# Patient Record
Sex: Female | Born: 1990
Health system: Southern US, Community
[De-identification: ages and names within clinical notes are randomized; demographics above are authoritative.]

## PROBLEM LIST (undated history)

## (undated) DIAGNOSIS — J45909 Unspecified asthma, uncomplicated: Secondary | ICD-10-CM

## (undated) DIAGNOSIS — G43909 Migraine, unspecified, not intractable, without status migrainosus: Secondary | ICD-10-CM

## (undated) DIAGNOSIS — T7840XA Allergy, unspecified, initial encounter: Secondary | ICD-10-CM

## (undated) HISTORY — PX: NO PAST SURGERIES: SHX2092

## (undated) HISTORY — DX: Unspecified asthma, uncomplicated: J45.909

## (undated) HISTORY — PX: WISDOM TOOTH EXTRACTION: SHX21

## (undated) HISTORY — DX: Migraine, unspecified, not intractable, without status migrainosus: G43.909

## (undated) HISTORY — DX: Allergy, unspecified, initial encounter: T78.40XA

---
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
## 2010-11-27 ENCOUNTER — Emergency Department: Payer: Self-pay | Admitting: Emergency Medicine

## 2013-01-24 ENCOUNTER — Encounter: Payer: Self-pay | Admitting: Family Medicine

## 2013-01-24 ENCOUNTER — Ambulatory Visit (INDEPENDENT_AMBULATORY_CARE_PROVIDER_SITE_OTHER): Payer: BC Managed Care – PPO | Admitting: Family Medicine

## 2013-01-24 VITALS — BP 112/72 | HR 65 | Temp 97.8°F | Ht 65.0 in | Wt 159.2 lb

## 2013-01-24 DIAGNOSIS — G43009 Migraine without aura, not intractable, without status migrainosus: Secondary | ICD-10-CM

## 2013-01-24 MED ORDER — SUMATRIPTAN SUCCINATE 100 MG PO TABS: 100.0000 mg | ORAL_TABLET | Freq: Every day | ORAL | Status: DC | PRN

## 2013-01-24 MED ORDER — CYCLOBENZAPRINE HCL 10 MG PO TABS: 5.0000 mg | ORAL_TABLET | Freq: Every day | ORAL | Status: DC | PRN

## 2013-01-24 NOTE — Patient Instructions (Signed)
l'll get a migraine diet for you.  Wean off the caffeine.  Try to avoid using tylenol, excedrin, ibuprofen, etc.  Take flexeril when you have a headache starting.  Take 1/2 a tab initially.  It can make you drowsy.  If not improved, you can take imitrex.  Give me an update early next week.  We may need to get you on daily meds for headache prevention.   Take care.

## 2013-01-25 ENCOUNTER — Telehealth: Payer: Self-pay | Admitting: *Deleted

## 2013-01-25 ENCOUNTER — Encounter: Payer: Self-pay | Admitting: Family Medicine

## 2013-01-25 DIAGNOSIS — G43009 Migraine without aura, not intractable, without status migrainosus: Secondary | ICD-10-CM | POA: Insufficient documentation

## 2013-01-25 NOTE — Progress Notes (Signed)
New patient.   Frequent HA.  Progressive since high school.  Throbs, nausea, light but not sound sensitive.  Would like to lay down in dark room with they happen.  Most days of month.  Some worse than others, 3-4 "bad" ones a month.  No aura.  Frontal pain.  Prev MRI neg.  Worse with menses.  No known concussions.  Caffeine helps.  Taking ibuprofen or similar most days with limited effect.   PMH and SH reviewed  ROS: See HPI, otherwise noncontributory.  Meds, vitals, and allergies reviewed.   GEN: nad, alert and oriented HEENT: mucous membranes moist NECK: supple w/o LA CV: rrr.  no murmur PULM: ctab, no inc wob ABD: soft, +bs EXT: no edema SKIN: no acute rash CN 2-12 wnl B, S/S/DTR wnl x4

## 2013-01-25 NOTE — Telephone Encounter (Signed)
Message copied by Binnie Kand on Wed Jan 25, 2013  4:24 PM ------      Message from: Joaquim Nam      Created: Wed Jan 25, 2013 11:21 AM       I printed a migraine diet, should be on the printer.  Please mail to patient.  Thanks.  Let me know if it doesn't print.              Clelia Croft       ------

## 2013-01-25 NOTE — Assessment & Plan Note (Signed)
Complicated by med overuse.  Would try flexeril for HA now with imitrex as backup, with routine cautions on both.  Will mail pt migraine diet handout.  Attempt to wean off caffeine, limit nsaids, report back with update.  Will likely need proph med.  Will see how patient is doing when she updates me.  Okay for outpatient f/u.  Nontoxic.  She agrees with plan.  Per patient, other outside records are in route. >30 min spent with face to face with patient, >50% counseling and/or coordinating care.

## 2013-01-25 NOTE — Telephone Encounter (Signed)
Diet printed and mailed to patient as instructed.

## 2013-01-26 ENCOUNTER — Encounter: Payer: Self-pay | Admitting: Family Medicine

## 2013-01-26 LAB — CHOLESTEROL, TOTAL
Creat: 0.75
Glucose: 106
TSH: 1.8

## 2013-02-06 ENCOUNTER — Other Ambulatory Visit: Payer: Self-pay | Admitting: Family Medicine

## 2013-02-06 ENCOUNTER — Encounter: Payer: Self-pay | Admitting: Family Medicine

## 2013-02-06 MED ORDER — CYCLOBENZAPRINE HCL 5 MG PO TABS: 2.5000 mg | ORAL_TABLET | Freq: Every day | ORAL | Status: DC | PRN

## 2013-02-07 ENCOUNTER — Other Ambulatory Visit: Payer: Self-pay | Admitting: Family Medicine

## 2013-02-07 MED ORDER — CYCLOBENZAPRINE HCL 5 MG PO TABS: 2.5000 mg | ORAL_TABLET | Freq: Every day | ORAL | Status: DC | PRN

## 2013-04-06 ENCOUNTER — Encounter: Payer: Self-pay | Admitting: Family Medicine

## 2013-04-06 ENCOUNTER — Ambulatory Visit (INDEPENDENT_AMBULATORY_CARE_PROVIDER_SITE_OTHER): Payer: BC Managed Care – PPO | Admitting: Family Medicine

## 2013-04-06 VITALS — BP 114/70 | HR 79 | Temp 97.9°F | Wt 165.2 lb

## 2013-04-06 DIAGNOSIS — R05 Cough: Secondary | ICD-10-CM | POA: Insufficient documentation

## 2013-04-06 DIAGNOSIS — R059 Cough, unspecified: Secondary | ICD-10-CM

## 2013-04-06 MED ORDER — FEXOFENADINE-PSEUDOEPHED ER 180-240 MG PO TB24: 1.0000 | ORAL_TABLET | Freq: Every day | ORAL | Status: DC

## 2013-04-06 MED ORDER — ALBUTEROL SULFATE HFA 108 (90 BASE) MCG/ACT IN AERS: 2.0000 | INHALATION_SPRAY | Freq: Four times a day (QID) | RESPIRATORY_TRACT | Status: DC | PRN

## 2013-04-06 NOTE — Assessment & Plan Note (Addendum)
Likely allergy induced.  Sinuses not ttp.  Doesn't tolerate nasal steroids. Would use SABA for now and allegra D in meantime. That should help.  Doesn't appear to be infectious. Nontoxic.

## 2013-04-06 NOTE — Patient Instructions (Signed)
Try allegra D and use the inhaler for the cough.  This should gradually get better.   Take care.  I would get a flu shot each fall.

## 2013-04-06 NOTE — Progress Notes (Signed)
Her HAs are much improved.  Flexeril helps the HA a lot.    Cough.  Trouble usually in spring and fall.  H/o asthma, season.  No wheeze.  Started after a cat dander exposure about 4 days ago.  Hasn't recently used SABA.  tmax ~99 the last 2 days.  Stuffy, not better with allegra and zyrtec.  Nonsmoker.  occ sputum, clear.  She doesn't tolerate nasal sprays.   Meds, vitals, and allergies reviewed.   ROS: See HPI.  Otherwise, noncontributory.  GEN: nad, alert and oriented HEENT: mucous membranes moist, tm w/o erythema, nasal exam w/o erythema, clear discharge noted,  OP with cobblestoning, sinuses not ttp NECK: supple w/o LA CV: rrr.   PULM: ctab, no inc wob EXT: no edema SKIN: no acute rash

## 2013-05-25 ENCOUNTER — Other Ambulatory Visit: Payer: Self-pay

## 2013-10-11 ENCOUNTER — Encounter: Payer: Self-pay | Admitting: Family Medicine

## 2013-10-11 ENCOUNTER — Ambulatory Visit (INDEPENDENT_AMBULATORY_CARE_PROVIDER_SITE_OTHER): Payer: BC Managed Care – PPO | Admitting: Family Medicine

## 2013-10-11 VITALS — BP 104/64 | HR 100 | Temp 99.0°F | Wt 171.5 lb

## 2013-10-11 DIAGNOSIS — J019 Acute sinusitis, unspecified: Secondary | ICD-10-CM

## 2013-10-11 MED ORDER — AMOXICILLIN-POT CLAVULANATE 875-125 MG PO TABS: 1.0000 | ORAL_TABLET | Freq: Two times a day (BID) | ORAL | Status: DC

## 2013-10-11 MED ORDER — BENZONATATE 200 MG PO CAPS: 200.0000 mg | ORAL_CAPSULE | Freq: Three times a day (TID) | ORAL | Status: DC | PRN

## 2013-10-11 NOTE — Assessment & Plan Note (Signed)
With second sickening.  Augmentin, tessalon, f/u prn.  See instructions.  Nontoxic.  D/w pt.

## 2013-10-11 NOTE — Patient Instructions (Signed)
Start the antibiotics and use the tessalon for the cough.  Drink plenty of fluids, take tylenol as needed, and gargle with warm salt water for your throat.  This should gradually improve.  Take care.  Let us know if you have other concerns.   Out of work tomorrow and likely Friday.

## 2013-10-11 NOTE — Progress Notes (Signed)
Pre visit review using our clinic review tool, if applicable. No additional management support is needed unless otherwise documented below in the visit note.  Sx started a few weeks ago, she got better, then got sick again.  Most recent downturn started about 1 week ago.  Was stuffy, then had a cough and fever.  Some sputum, yellow.  Ear pain, ST, rhinorrhea.  More chest sx recently.  No GI sx. No rash.  Maxillary pain B.    Meds, vitals, and allergies reviewed.   ROS: See HPI.  Otherwise, noncontributory.  GEN: nad, alert and oriented HEENT: mucous membranes moist, tm w/o erythema, nasal exam w/o erythema, clear discharge noted,  OP with cobblestoning, sinuses ttp x4 NECK: supple w/o LA CV: rrr.   PULM: ctab, no inc wob EXT: no edema SKIN: no acute rash

## 2013-10-16 ENCOUNTER — Encounter: Payer: Self-pay | Admitting: Family Medicine

## 2013-10-16 ENCOUNTER — Other Ambulatory Visit: Payer: Self-pay | Admitting: Family Medicine

## 2013-10-16 MED ORDER — HYDROCODONE-HOMATROPINE 5-1.5 MG/5ML PO SYRP: 5.0000 mL | ORAL_SOLUTION | Freq: Three times a day (TID) | ORAL | Status: DC | PRN

## 2014-02-01 ENCOUNTER — Ambulatory Visit (INDEPENDENT_AMBULATORY_CARE_PROVIDER_SITE_OTHER): Payer: BC Managed Care – PPO | Admitting: Family Medicine

## 2014-02-01 ENCOUNTER — Encounter: Payer: Self-pay | Admitting: Family Medicine

## 2014-02-01 VITALS — BP 98/72 | HR 78 | Temp 98.1°F | Wt 169.2 lb

## 2014-02-01 DIAGNOSIS — J069 Acute upper respiratory infection, unspecified: Secondary | ICD-10-CM

## 2014-02-01 MED ORDER — FLUTICASONE PROPIONATE 50 MCG/ACT NA SUSP: 2.0000 | Freq: Every day | NASAL | Status: DC

## 2014-02-01 NOTE — Assessment & Plan Note (Signed)
Nontoxic, with ETD.  D/w pt.  Would use flonase for now and fu prn. Supportive care o/w.  She agrees.  See instructions.

## 2014-02-01 NOTE — Patient Instructions (Signed)
Use the flonase daily and gently try to pop your ears.   Drink plenty of fluids.   Take care.

## 2014-02-01 NOTE — Progress Notes (Signed)
Pre visit review using our clinic review tool, if applicable. No additional management support is needed unless otherwise documented below in the visit note.  Sx started recently, ~1-2 days ago.  R ear pain.  Post nasal gtt.  L sided maxillary pain.  No pain chewing.  No FCNAVD.  Some cough.  Hasn't used her inhaler recently.    Meds, vitals, and allergies reviewed.   ROS: See HPI.  Otherwise, noncontributory.  GEN: nad, alert and oriented HEENT: mucous membranes moist, tm w/o erythema, nasal exam w/o erythema, clear discharge noted,  OP with cobblestoning, R ETD noted on exam. L max sinus ttp NECK: supple w/o LA CV: rrr.   PULM: ctab, no inc wob EXT: no edema SKIN: no acute rash

## 2014-09-17 ENCOUNTER — Ambulatory Visit (INDEPENDENT_AMBULATORY_CARE_PROVIDER_SITE_OTHER): Payer: 59 | Admitting: Family Medicine

## 2014-09-17 ENCOUNTER — Encounter: Payer: Self-pay | Admitting: Family Medicine

## 2014-09-17 VITALS — BP 126/86 | HR 92 | Temp 98.6°F | Wt 173.0 lb

## 2014-09-17 DIAGNOSIS — R059 Cough, unspecified: Secondary | ICD-10-CM

## 2014-09-17 DIAGNOSIS — R05 Cough: Secondary | ICD-10-CM

## 2014-09-17 MED ORDER — AZITHROMYCIN 250 MG PO TABS: ORAL_TABLET | ORAL | Status: DC

## 2014-09-17 MED ORDER — HYDROCOD POLST-CHLORPHEN POLST 10-8 MG/5ML PO LQCR: 5.0000 mL | Freq: Two times a day (BID) | ORAL | Status: DC | PRN

## 2014-09-17 NOTE — Progress Notes (Signed)
Pre visit review using our clinic review tool, if applicable. No additional management support is needed unless otherwise documented below in the visit note.  duration of symptoms: about 1 month, worse recently.   Rhinorrhea: yes, some Congestion: yes ear pain: yes, R>L sore throat: no Cough: yes, started recently.  SABA didn't help.  No wheeze.  Sputum noted, she didn't check the color.   myalgias:no other concerns:no fevers now, possibly previous.  No vomiting, no diarrhea, no rash.    ROS: See HPI.  Otherwise negative.    Meds, vitals, and allergies reviewed.   GEN: nad, alert and oriented HEENT: mucous membranes moist, TM w/o erythema, nasal epithelium injected, OP with cobblestoning, R SOM noted. , sinuses not ttp.  NECK: supple w/o LA CV: rrr. PULM: ctab, no inc wob ABD: soft, +bs EXT: no edema

## 2014-09-17 NOTE — Patient Instructions (Signed)
Use the cough medicine for now.  Rest and fluids in the meantime.  Out of work for now.  Start the antibiotic if not better in a few days.  Take care.  Glad to see you.

## 2014-09-17 NOTE — Assessment & Plan Note (Signed)
Nontoxic, ctab, no wheeze.  Use tussionex for now and then start zmax in a few days if not better.  She agrees.  Okay for outpatient f/u.

## 2014-09-26 ENCOUNTER — Telehealth: Payer: Self-pay | Admitting: Family Medicine

## 2014-09-26 ENCOUNTER — Encounter: Payer: Self-pay | Admitting: Family Medicine

## 2014-09-26 NOTE — Telephone Encounter (Signed)
Please call pt about getting seen again, today or tomorrow.  Needs recheck for cough.  See mychart message.  Thanks.

## 2014-09-27 NOTE — Telephone Encounter (Signed)
Patient advised. Appointment scheduled.  

## 2014-09-28 ENCOUNTER — Ambulatory Visit (INDEPENDENT_AMBULATORY_CARE_PROVIDER_SITE_OTHER)
Admission: RE | Admit: 2014-09-28 | Discharge: 2014-09-28 | Disposition: A | Discharge status: Home / Self Care | Payer: 59 | Source: Ambulatory Visit | Attending: Family Medicine | Admitting: Family Medicine

## 2014-09-28 ENCOUNTER — Ambulatory Visit (INDEPENDENT_AMBULATORY_CARE_PROVIDER_SITE_OTHER): Payer: 59 | Admitting: Family Medicine

## 2014-09-28 ENCOUNTER — Encounter: Payer: Self-pay | Admitting: Family Medicine

## 2014-09-28 VITALS — BP 110/72 | HR 96 | Temp 98.6°F | Wt 176.2 lb

## 2014-09-28 DIAGNOSIS — R059 Cough, unspecified: Secondary | ICD-10-CM

## 2014-09-28 DIAGNOSIS — R05 Cough: Secondary | ICD-10-CM

## 2014-09-28 MED ORDER — TRAMADOL HCL 50 MG PO TABS: 50.0000 mg | ORAL_TABLET | Freq: Three times a day (TID) | ORAL | Status: DC | PRN

## 2014-09-28 NOTE — Patient Instructions (Signed)
Try tramadol for the cough during he day.  It may still make you drowsy, but not as much as the cough syrup.   Try to get some rest.  Go to the lab on the way out.  We'll contact you with your xray report.

## 2014-09-28 NOTE — Progress Notes (Signed)
Pre visit review using our clinic review tool, if applicable. No additional management support is needed unless otherwise documented below in the visit note.  Cough.  SABA didn't help at all.  Cough syrup helps at night, but she can't take it during the day.  Sick for about 1 month.  Done with zmax now.  No FCNAV.  Still with cough, still with some sputum.  Still with some wheeze per patient.  No ear pain, no ST.  Some rhinorrhea.  Tessalon didn't help her cough prev.  She is some better today than the last few days, less frequent cough.  She has had fatigue, some better today.    LMP was 09/09/14.  She hasn't missed a period.    Meds, vitals, and allergies reviewed.   ROS: See HPI.  Otherwise, noncontributory.  GEN: nad, alert and oriented HEENT: mucous membranes moist NECK: supple w/o LA CV: rrr.   PULM: ctab, no inc wob, cough noted.  No wheeze.  ABD: soft, +bs EXT: no edema SKIN: no acute rash

## 2014-09-30 NOTE — Assessment & Plan Note (Signed)
Can try tramadol for the cough during the day.  Sedation caution. Rest and fluids o/w.  See notes on CXR.  Nontoxic.

## 2015-02-28 ENCOUNTER — Ambulatory Visit (INDEPENDENT_AMBULATORY_CARE_PROVIDER_SITE_OTHER)
Admission: RE | Admit: 2015-02-28 | Discharge: 2015-02-28 | Disposition: A | Discharge status: Home / Self Care | Payer: 59 | Source: Ambulatory Visit | Attending: Family Medicine | Admitting: Family Medicine

## 2015-02-28 ENCOUNTER — Ambulatory Visit (INDEPENDENT_AMBULATORY_CARE_PROVIDER_SITE_OTHER): Payer: 59 | Admitting: Family Medicine

## 2015-02-28 ENCOUNTER — Encounter: Payer: Self-pay | Admitting: Family Medicine

## 2015-02-28 VITALS — BP 110/70 | HR 88 | Temp 98.2°F | Wt 178.8 lb

## 2015-02-28 DIAGNOSIS — S92302A Fracture of unspecified metatarsal bone(s), left foot, initial encounter for closed fracture: Secondary | ICD-10-CM

## 2015-02-28 DIAGNOSIS — S93402A Sprain of unspecified ligament of left ankle, initial encounter: Secondary | ICD-10-CM

## 2015-02-28 DIAGNOSIS — S92353A Displaced fracture of fifth metatarsal bone, unspecified foot, initial encounter for closed fracture: Secondary | ICD-10-CM | POA: Insufficient documentation

## 2015-02-28 NOTE — Progress Notes (Signed)
Pre visit review using our clinic review tool, if applicable. No additional management support is needed unless otherwise documented below in the visit note.  Twisted L ankle 1 week ago.  She was doing step ups/downs in a class.  She didn't step down correctly and inverted her L ankle.  Swelled thereafter.  Proximal bruising now, then it travelled distally.  Can bear weight.  Not painful not unless she misses a step.  She has walking boot and has been using it.  Icing her foot.  Not needing ibuprofen.  LMP 02/07/15, normal.   She has gotten better each day for the last few days, clearly improved.  H/o L ankle sprain in the past.    Meds, vitals, and allergies reviewed.   ROS: See HPI.  Otherwise, noncontributory.  nad L knee not ttp Able to bear weight with a limp from L foot L ankle not puffy and malleoli not ttp B but L lateral 5th ttp Mortise intact Normal DP pulse   Distally bruised but normal toe ROM x5, distally NV intact  xrays reviewed, likely fracture of 5th metatarsal.  No other acute process noted.

## 2015-02-28 NOTE — Assessment & Plan Note (Signed)
Likely fracture of 5th metatarsal, await overread.   Ice as needed.  No pain when in a boot.   She will schedule follow up with Dr. Patsy Lager in about 10 days, sooner if needed.   She can f/u with me if she can't get in with [facility].   No weight bearing out of the boot.   Okay to drive- automatic and no use of L foot at baseline.  D/w pt.  She agrees.

## 2015-02-28 NOTE — Patient Instructions (Signed)
Get back in the boot when weight bearing and I want you to schedule follow up with Dr. Patsy Lager in about 10 days, sooner if needed.   No weight bearing out of the boot.   Likely fracture of 5th metatarsal.  Take care.  Glad to see you.

## 2015-03-07 ENCOUNTER — Ambulatory Visit (INDEPENDENT_AMBULATORY_CARE_PROVIDER_SITE_OTHER)
Admission: RE | Admit: 2015-03-07 | Discharge: 2015-03-07 | Disposition: A | Discharge status: Home / Self Care | Payer: 59 | Source: Ambulatory Visit | Attending: Family Medicine | Admitting: Family Medicine

## 2015-03-07 ENCOUNTER — Encounter: Payer: Self-pay | Admitting: Family Medicine

## 2015-03-07 ENCOUNTER — Ambulatory Visit (INDEPENDENT_AMBULATORY_CARE_PROVIDER_SITE_OTHER): Payer: 59 | Admitting: Family Medicine

## 2015-03-07 VITALS — BP 100/60 | HR 84 | Temp 98.6°F | Ht 63.0 in | Wt 179.5 lb

## 2015-03-07 DIAGNOSIS — S92301A Fracture of unspecified metatarsal bone(s), right foot, initial encounter for closed fracture: Secondary | ICD-10-CM

## 2015-03-07 DIAGNOSIS — S92302D Fracture of unspecified metatarsal bone(s), left foot, subsequent encounter for fracture with routine healing: Secondary | ICD-10-CM

## 2015-03-07 LAB — POCT URINE PREGNANCY: Preg Test, Ur: NEGATIVE

## 2015-03-07 NOTE — Progress Notes (Signed)
Pre visit review using our clinic review tool, if applicable. No additional management support is needed unless otherwise documented below in the visit note. 

## 2015-03-09 NOTE — Progress Notes (Signed)
Dr. Karleen Hampshire T. Cochise Dinneen, MD, CAQ Sports Medicine Primary [facility] and Sports Medicine [address] [place], [address] Phone: [phone] Fax: [phone]  03/07/2015  Patient: Margaret Cunningham, MRN: [id number], DOB: [birth year], 24 y.o.  Primary Physician:  Crawford Givens, MD  Chief Complaint: Follow-up  Subjective:   Margaret Cunningham is a 24 y.o. very pleasant female patient who presents with the following:  Pleasant patient seen by my partner on February 28, 2015.  She had had an inversion injury on the left side approximately one week prior to this, and she thought that she had a simple ankle sprain.  As her foot had more swelling and bruising in the forefoot region, her husband recommended that she follow-up for evaluation.  Subsequent foot radiographs showed a nondisplaced fifth metatarsal fracture. She had a full-length pneumatic cam walker boot at home, and she is been using this without any significant difficulty for one week.  Past Medical History, Surgical History, Social History, Family History, Problem List, Medications, and Allergies have been reviewed and updated if relevant.  Patient Active Problem List   Diagnosis Date Noted  . Fracture of 5th metatarsal 02/28/2015  . URI (upper respiratory infection) 02/01/2014  . Cough 04/06/2013  . Migraine without aura 01/25/2013    Past Medical History  Diagnosis Date  . Migraine   . Asthma     esp in childhood  . Allergy     Past Surgical History  Procedure Laterality Date  . No past surgeries      Social History   Social History  . Marital Status: Single    Spouse Name: N/A  . Number of Children: N/A  . Years of Education: N/A   Occupational History  . Not on file.   Social History Main Topics  . Smoking status: Never Smoker   . Smokeless tobacco: Never Used  . Alcohol Use: No  . Drug Use: No  . Sexual Activity: Not on file   Other Topics Concern  . Not on file   Social History Narrative   Engaged 2014   Works at [employer]   [employer] grad, business administration    No family history on file.  No Known Allergies  Medication list reviewed and updated in full in [place] [place] Link.  GEN: No fevers, chills. Nontoxic. Primarily MSK c/o today. MSK: Detailed in the HPI GI: tolerating PO intake without difficulty Neuro: No numbness, parasthesias, or tingling associated. Otherwise the pertinent positives of the ROS are noted above.   Objective:   BP 100/60 mmHg  Pulse 84  Temp(Src) 98.6 F (37 C) (Oral)  Ht 5\' 3"  (1.6 m)  Wt 179 lb 8 oz (81.421 kg)  BMI 31.81 kg/m2  LMP 02/07/2015 (Exact Date)   GEN: WDWN, NAD, Non-toxic, Alert & Oriented x 3 HEENT: Atraumatic, Normocephalic.  Ears and Nose: No external deformity. EXTR: No clubbing/cyanosis/edema NEURO: Normal gait in CAM PSYCH: Normally interactive. Conversant. Not depressed or anxious appearing.  Calm demeanor.    Left foot and ankle: Patient does have some significant swelling in the forefoot and midfoot.  She also has bruising in her forefoot diffusely.  Medial and lateral malleoli are nontender.  ATFL, CFL, and deltoid ligaments are nontender currently.  She has relatively normal ankle motion currently.  Nontender throughout metatarsals 1 through 4.  All phalanges are normal.  Midfoot is nontender.  The base of the fifth metatarsal is tender to palpation, but vigorous palpation is not done  given known fracture.  Radiology: Dg Foot Complete Left  03/07/2015   CLINICAL DATA:  Follow-up of fracture of the left fifth metatarsal bone.  EXAM: LEFT FOOT - COMPLETE 3+ VIEW  COMPARISON:  02/28/2015  FINDINGS: There is increased lucency involving the fracture at the base of the fifth metatarsal bone. Alignment of the metatarsal bone has not significant changed and there is no significant fracture displacement. No new fractures.  IMPRESSION: Increased lucency involving the fracture at the base of the  fifth metatarsal bone. Findings probably represent normal bone absorption.   Electronically Signed   By: Richarda Overlie M.D.   On: 03/07/2015 16:58   Dg Foot Complete Left  02/28/2015   CLINICAL DATA:  Sprain left ankle.  EXAM: LEFT FOOT - COMPLETE 3+ VIEW  COMPARISON:  None.  FINDINGS: Nondisplaced fracture of the base of the left fifth metatarsal is noted. No other focal abnormality identified. No evidence dislocation .  IMPRESSION: Nondisplaced fracture of the base of the left fifth metatarsal.   Electronically Signed   By: Maisie Fus  Register   On: 02/28/2015 10:44   Results for orders placed or performed in visit on 03/07/15  POCT urine pregnancy  Result Value Ref Range   Preg Test, Ur Negative Negative     Assessment and Plan:   Fracture of 5th metatarsal, right, closed, initial encounter - Plan: DG Foot Complete Left, POCT urine pregnancy  Nondisplaced fracture of the base of the fifth metatarsal in appropriate location for conservative treatment and management.  Not consistent with Jones fracture location.  Continue pneumatic cam walker boot for 3 more weeks then reevaluate clinically.  Fracture currently stable.  Likely in a 24 year old we will be able to discontinue immobilization at this point.  I appreciate the opportunity to evaluate this very friendly patient. If you have any question regarding her care or prognosis, do not hesitate to ask.   Follow-up: Return in about 3 weeks (around 03/28/2015).  Orders Placed This Encounter  Procedures  . DG Foot Complete Left  . POCT urine pregnancy    Signed,  Stephanee Barcomb T. Rickiya Picariello, MD   Patient's Medications  New Prescriptions   No medications on file  Previous Medications   CYCLOBENZAPRINE (FLEXERIL) 5 MG TABLET    Take 0.5-1 tablets (2.5-5 mg total) by mouth daily as needed (headache).   PRENATAL VITAMIN W/FE, FA (NATACHEW) 29-1 MG CHEW CHEWABLE TABLET    Chew 1 tablet by mouth daily at 12 noon.   SUMATRIPTAN (IMITREX) 100 MG TABLET     Take 1 tablet (100 mg total) by mouth daily as needed for migraine (limit use as much as possible).   TRAMADOL (ULTRAM) 50 MG TABLET    Take 1 tablet (50 mg total) by mouth every 8 (eight) hours as needed (for cough).  Modified Medications   No medications on file  Discontinued Medications   No medications on file

## 2015-03-28 ENCOUNTER — Ambulatory Visit (INDEPENDENT_AMBULATORY_CARE_PROVIDER_SITE_OTHER)
Admission: RE | Admit: 2015-03-28 | Discharge: 2015-03-28 | Disposition: A | Discharge status: Home / Self Care | Payer: 59 | Source: Ambulatory Visit | Attending: Family Medicine | Admitting: Family Medicine

## 2015-03-28 ENCOUNTER — Ambulatory Visit (INDEPENDENT_AMBULATORY_CARE_PROVIDER_SITE_OTHER): Payer: 59 | Admitting: Family Medicine

## 2015-03-28 ENCOUNTER — Encounter: Payer: Self-pay | Admitting: Family Medicine

## 2015-03-28 VITALS — BP 110/58 | HR 103 | Temp 98.1°F | Wt 179.0 lb

## 2015-03-28 DIAGNOSIS — S92302S Fracture of unspecified metatarsal bone(s), left foot, sequela: Secondary | ICD-10-CM

## 2015-03-28 NOTE — Patient Instructions (Signed)
Gradually wean out of the boot over the next week.   Start ROM exercises.  Get the boot off and trace ABCs.   Go to the lab on the way out.  We'll contact you with your xray report.

## 2015-03-28 NOTE — Progress Notes (Signed)
Pre visit review using our clinic review tool, if applicable. No additional management support is needed unless otherwise documented below in the visit note.  F/u for nondisplaced fracture of the base of the left fifth metatarsal.  Has been in the boot for about 6 weeks.   In CAM walker consistently when walking/weight bearing.  Pain at base of 5th is improved.  Now with some pain proximal to the L lat mal.  No bruising.   LMP 03/17/15, normal.   nad Able to bear weight but can't do single leg calf raise likely due to time in boot.   Minimally ttp prox to the the L lat mal (this appear to be along the tendon sheath and not at a bony prominence) but 5th MT isn't ttp NV intact No bruising.

## 2015-03-29 NOTE — Assessment & Plan Note (Signed)
I was going to wean her out of the boot but the continued lucency on xray gives me pause.  Will get patient back in the boot and will d/w Dr. Patsy Lager in the meantime.  She is clinically improved and okay for outpatient f/u.

## 2015-04-10 ENCOUNTER — Ambulatory Visit (INDEPENDENT_AMBULATORY_CARE_PROVIDER_SITE_OTHER): Payer: 59 | Admitting: Family Medicine

## 2015-04-10 ENCOUNTER — Ambulatory Visit (INDEPENDENT_AMBULATORY_CARE_PROVIDER_SITE_OTHER)
Admission: RE | Admit: 2015-04-10 | Discharge: 2015-04-10 | Disposition: A | Discharge status: Home / Self Care | Payer: 59 | Source: Ambulatory Visit | Attending: Family Medicine | Admitting: Family Medicine

## 2015-04-10 ENCOUNTER — Encounter: Payer: Self-pay | Admitting: Family Medicine

## 2015-04-10 DIAGNOSIS — S92302D Fracture of unspecified metatarsal bone(s), left foot, subsequent encounter for fracture with routine healing: Secondary | ICD-10-CM

## 2015-04-10 LAB — POCT URINE PREGNANCY: PREG TEST UR: NEGATIVE

## 2015-04-10 NOTE — Progress Notes (Signed)
Pre visit review using our clinic review tool, if applicable. No additional management support is needed unless otherwise documented below in the visit note. 

## 2015-04-10 NOTE — Progress Notes (Signed)
Dr. Seidy Labreck T. Pricila Bridge, MD, CAQ Sports Medicine Primary [facility] and Sports Medicine [address] [place], [address] Phone: [phone] Fax: [phone]  04/10/2015  Patient: Margaret Cunningham, MRN: [id number], DOB: Apr 05, 1991, 24 y.o.  Primary Physician:  Crawford Givens, MD  Chief Complaint: Follow-up  Subjective:   Theola Cuellar Asmar is a 24 y.o. very pleasant female patient who presents with the following:  The patient is here in follow-up for nondisplaced left fifth base metatarsal fracture.  She was seen a few weeks ago by my partner, and at that point had no or minimal radiographical healing of her fracture.  She is here today and has been quite compliant with wearing her cam walker boot.  She is having minimal pain only.  03/07/2015 Last OV with Hannah Beat, MD  Pleasant patient seen by my partner on February 28, 2015.  She had had an inversion injury on the left side approximately one week prior to this, and she thought that she had a simple ankle sprain.  As her foot had more swelling and bruising in the forefoot region, her husband recommended that she follow-up for evaluation.  Subsequent foot radiographs showed a nondisplaced fifth metatarsal fracture. She had a full-length pneumatic cam walker boot at home, and she is been using this without any significant difficulty for one week.  Past Medical History, Surgical History, Social History, Family History, Problem List, Medications, and Allergies have been reviewed and updated if relevant.  Patient Active Problem List   Diagnosis Date Noted  . Fracture of 5th metatarsal 02/28/2015  . URI (upper respiratory infection) 02/01/2014  . Cough 04/06/2013  . Migraine without aura 01/25/2013    Past Medical History  Diagnosis Date  . Migraine   . Asthma     esp in childhood  . Allergy     Past Surgical History  Procedure Laterality Date  . No past surgeries      Social History   Social History  . Marital  Status: Single    Spouse Name: N/A  . Number of Children: N/A  . Years of Education: N/A   Occupational History  . Not on file.   Social History Main Topics  . Smoking status: Never Smoker   . Smokeless tobacco: Never Used  . Alcohol Use: No  . Drug Use: No  . Sexual Activity: Not on file   Other Topics Concern  . Not on file   Social History Narrative   Engaged 2014   Works at [employer]   [employer] grad, business administration    No family history on file.  No Known Allergies  Medication list reviewed and updated in full in [facility] Link.  GEN: No fevers, chills. Nontoxic. Primarily MSK c/o today. MSK: Detailed in the HPI GI: tolerating PO intake without difficulty Neuro: No numbness, parasthesias, or tingling associated. Otherwise the pertinent positives of the ROS are noted above.   Objective:   BP 100/60 mmHg  Pulse 79  Temp(Src) 98.5 F (36.9 C) (Oral)  Ht  (1.6 m)  Wt 181 lb 8 oz (82.328 kg)  BMI 32.16 kg/m2  LMP 03/17/2015   GEN: WDWN, NAD, Non-toxic, Alert & Oriented x 3 HEENT: Atraumatic, Normocephalic.  Ears and Nose: No external deformity. EXTR: No clubbing/cyanosis/edema NEURO: Normal gait in CAM PSYCH: Normally interactive. Conversant. Not depressed or anxious appearing.  Calm demeanor.    LeftKarleen Hampshiret and ankle: Patient does have no swelling in the forefoot and  midfoot.  No bruising  Medial and lateral malleoli are nontender.  ATFL, CFL, and deltoid ligaments are nontender currently.  She has relatively normal ankle motion currently.  Nontender throughout metatarsals 1 through 4.  All phalanges are normal.  Midfoot is nontender.  The base of the fifth metatarsal is NT to palpation, but some pain with resisted eversion  Radiology: Dg Foot Complete Left  04/10/2015   CLINICAL DATA:  Fifth metatarsal fracture.  EXAM: LEFT FOOT - COMPLETE 3+ VIEW  COMPARISON:  Radiographs dated 03/28/2015, 03/07/2015 and 02/28/2015   FINDINGS: The fracture line through the base of the fifth metatarsal is much less distinct than on the prior studies, indicating partial healing. The other bones of the foot appear normal.  IMPRESSION: Interval partial healing of the fracture of the base of the fifth metatarsal.   Electronically Signed   By: Francene Boyers M.D.   On: 04/10/2015 16:54   Results for orders placed or performed in visit on 04/10/15  POCT urine pregnancy  Result Value Ref Range   Preg Test, Ur Negative Negative     Assessment and Plan:   Fracture of 5th metatarsal, left, with routine healing, subsequent encounter - Plan: DG Foot Complete Left, POCT urine pregnancy, CANCELED: DG Wrist Complete Left  She looks very good.  No swelling or bruising in the foot.  Bone is nontender to palpation.  Radiographical healing is evident on x-rays.  Much improved compared to priors.  Discontinue cam walker boot.  She is going to wear it on Saturday on a trip to [place] for protection. She could use it for short stretches the next few days if needed. Reviewed peroneal and balance rehab.  Follow-up: prn  Orders Placed This Encounter  Procedures  . DG Foot Complete Left  . POCT urine pregnancy    Signed,  Donna Snooks T. Crystelle Ferrufino, MD   Patient's Medications  New Prescriptions   No medications on file  Previous Medications   CYCLOBENZAPRINE (FLEXERIL) 5 MG TABLET    Take 0.5-1 tablets (2.5-5 mg total) by mouth daily as needed (headache).   PRENATAL VITAMIN W/FE, FA (NATACHEW) 29-1 MG CHEW CHEWABLE TABLET    Chew 1 tablet by mouth daily at 12 noon.   SUMATRIPTAN (IMITREX) 100 MG TABLET    Take 1 tablet (100 mg total) by mouth daily as needed for migraine (limit use as much as possible).  Modified Medications   No medications on file  Discontinued Medications   TRAMADOL (ULTRAM) 50 MG TABLET    Take 1 tablet (50 mg total) by mouth every 8 (eight) hours as needed (for cough).

## 2015-06-07 LAB — HM PAP SMEAR

## 2015-06-20 ENCOUNTER — Encounter: Payer: Self-pay | Admitting: Family Medicine

## 2015-07-18 ENCOUNTER — Encounter: Payer: Self-pay | Admitting: Family Medicine

## 2015-07-18 ENCOUNTER — Ambulatory Visit (INDEPENDENT_AMBULATORY_CARE_PROVIDER_SITE_OTHER): Payer: 59 | Admitting: Family Medicine

## 2015-07-18 VITALS — BP 102/72 | HR 92 | Temp 99.0°F | Wt 183.0 lb

## 2015-07-18 DIAGNOSIS — R05 Cough: Secondary | ICD-10-CM | POA: Diagnosis not present

## 2015-07-18 DIAGNOSIS — R059 Cough, unspecified: Secondary | ICD-10-CM

## 2015-07-18 MED ORDER — HYDROCODONE-HOMATROPINE 5-1.5 MG/5ML PO SYRP: 5.0000 mL | ORAL_SOLUTION | Freq: Three times a day (TID) | ORAL | Status: DC | PRN

## 2015-07-18 NOTE — Patient Instructions (Signed)
Out of work until your cough is better.  Rest and fluids for now.  Use the cough medicine as needed.  Sedation caution.  Take care.  Glad to see you.

## 2015-07-18 NOTE — Progress Notes (Signed)
Pre visit review using our clinic review tool, if applicable. No additional management support is needed unless otherwise documented below in the visit note.  Sx started 07/15/15.  First noted ST.  Chest congestion.  Not SOB.  Still with irritated throat.  Pain with a deep breath.  Some B ear pain.  No rhinorrhea.   Cough noted.  No facial pain.  Some sputum.  She didn't sleep well last night.  Fevers prev, not yesterday.  Sick contacts noted.  Taking ibuprofen occ, w/o much relief.  She had likely pulled a chest wall muscle before getting sick, with more irritation with the coughing.   Meds, vitals, and allergies reviewed.   ROS: See HPI.  Otherwise, noncontributory.  GEN: nad, alert and oriented HEENT: mucous membranes moist, tm w/o erythema, nasal exam w/o erythema, clear discharge noted,  OP wnl, R frontal sinus slightly ttp NECK: supple w/o LA CV: rrr.   PULM: ctab, no inc wob, no wheeze, no rales, chest wall ttp but less pain with compression of chest wall during a deep breath.  SKIN: no acute rash but chronic erythema noted on the anterior lower neck (variable at baseline, noted for years by patient)

## 2015-07-18 NOTE — Assessment & Plan Note (Signed)
Likely viral, nontoxic. D/w pt.  Wouldn't start abx at this point.  Rest and fluids, ibuprofen prn, d/w pt.   Use hycodan prn, sedation caution.  LMP 06/20/15, hasn't missed a period.   Okay for outpatient f/u.   Update me as needed.

## 2015-07-22 ENCOUNTER — Encounter: Payer: Self-pay | Admitting: Family Medicine

## 2015-07-23 ENCOUNTER — Other Ambulatory Visit: Payer: Self-pay | Admitting: Family Medicine

## 2015-07-23 MED ORDER — AMOXICILLIN-POT CLAVULANATE 875-125 MG PO TABS: 1.0000 | ORAL_TABLET | Freq: Two times a day (BID) | ORAL | Status: DC

## 2016-01-01 ENCOUNTER — Ambulatory Visit (INDEPENDENT_AMBULATORY_CARE_PROVIDER_SITE_OTHER): Payer: BLUE CROSS/BLUE SHIELD | Admitting: Family Medicine

## 2016-01-01 ENCOUNTER — Ambulatory Visit (INDEPENDENT_AMBULATORY_CARE_PROVIDER_SITE_OTHER)
Admission: RE | Admit: 2016-01-01 | Discharge: 2016-01-01 | Disposition: A | Discharge status: Home / Self Care | Payer: BLUE CROSS/BLUE SHIELD | Source: Ambulatory Visit | Attending: Family Medicine | Admitting: Family Medicine

## 2016-01-01 ENCOUNTER — Encounter: Payer: Self-pay | Admitting: Family Medicine

## 2016-01-01 VITALS — BP 110/68 | HR 96 | Temp 98.5°F | Ht 63.0 in | Wt 180.8 lb

## 2016-01-01 DIAGNOSIS — M25579 Pain in unspecified ankle and joints of unspecified foot: Secondary | ICD-10-CM | POA: Insufficient documentation

## 2016-01-01 DIAGNOSIS — M25572 Pain in left ankle and joints of left foot: Secondary | ICD-10-CM | POA: Diagnosis not present

## 2016-01-01 DIAGNOSIS — M79672 Pain in left foot: Secondary | ICD-10-CM

## 2016-01-01 NOTE — Assessment & Plan Note (Addendum)
Imaging from today and prev 5th MT fx reviewed, d/w pt.  Possible lucency in the prox 5th MT, cough be old healing or new fx.  D/w pt.  I think the other pain in the foot is from abnormality of gait, walking on the medial portion of the foot causing other discomfort.  She agrees.  CAM walker for now, routine cautions.  Okay to drive automatic since she doesn't use L foot driving.  Recheck with Dr. Patsy Lageropland in about 2 weeks.  See AVS. >25 minutes spent in face to face time with patient, >50% spent in counselling or coordination of care. She felt better in CAM walker at the OV.

## 2016-01-01 NOTE — Progress Notes (Signed)
Pre visit review using our clinic review tool, if applicable. No additional management support is needed unless otherwise documented below in the visit note. 

## 2016-01-01 NOTE — Progress Notes (Signed)
Foot pain.  H/o L 5th MT fx.  Constant pain.  Felt a pop recently, last week.  No trauma at the time, she was just walking over a step.  Pain is similar to prev pain with MT fx.   Can still bear weight.  Pain at rest or walking.   No R foot pain.  No bruising.    On menses currently.    Meds, vitals, and allergies reviewed.   ROS: Per HPI unless specifically indicated in ROS section   nad Able to bear weight Ankle not bruised and not ttp at the med and lat mal on L ankle but ttp at the tib and fib proximal to the mal B. Achilles not ttp L 5th ttp.  Midfoot not ttp o/w.  DP pulse intact.  No bruising.  No swelling L foot.

## 2016-01-01 NOTE — Patient Instructions (Signed)
Use the CAM boot in the meantime when weight bearing and schedule a follow up with Dr. Patsy Lageropland in about 2 weeks.  We'll contact you with your xray report. Take care.  Glad to see you.

## 2016-01-15 ENCOUNTER — Ambulatory Visit (INDEPENDENT_AMBULATORY_CARE_PROVIDER_SITE_OTHER): Payer: BLUE CROSS/BLUE SHIELD | Admitting: Family Medicine

## 2016-01-15 ENCOUNTER — Encounter: Payer: Self-pay | Admitting: Family Medicine

## 2016-01-15 VITALS — BP 118/76 | HR 73 | Temp 97.4°F | Ht 63.0 in | Wt 183.8 lb

## 2016-01-15 DIAGNOSIS — M7672 Peroneal tendinitis, left leg: Secondary | ICD-10-CM | POA: Diagnosis not present

## 2016-01-15 DIAGNOSIS — S93412A Sprain of calcaneofibular ligament of left ankle, initial encounter: Secondary | ICD-10-CM

## 2016-01-15 DIAGNOSIS — S93492A Sprain of other ligament of left ankle, initial encounter: Secondary | ICD-10-CM | POA: Diagnosis not present

## 2016-01-15 DIAGNOSIS — M79672 Pain in left foot: Secondary | ICD-10-CM | POA: Diagnosis not present

## 2016-01-15 NOTE — Patient Instructions (Signed)
Wear CAM walker boot for 1 more week, then transition out of your boot into your lace up ankle brace.

## 2016-01-15 NOTE — Progress Notes (Signed)
Dr. Karleen HampshireSpencer T. Noelie Renfrow, MD, CAQ Sports Medicine [facility] Sports Medicine [address] [place], [id number] Phone: [phone] Fax: [phone]  01/15/2016  Patient: Margaret SonsCourtney M Cunningham, MRN: [id number], DOB: 1991/02/02, 25 y.o.  Primary Physician:  Crawford GivensGraham Duncan, MD   Chief Complaint  Patient presents with  . Foot Pain    left foot   Subjective:   Margaret ChambersCourtney M Cunningham is a 25 y.o. very pleasant female patient who presents with the following:  Pleasant 25 year old lady who I remember well who had a fifth metatarsal fracture 1 year ago. 2 weeks ago, she saw my partner, and reported that she had felt a pop in her left foot, on the same side where she had her prior fifth metatarsal fracture.  At that point she was placed in her short pneumatic fracture boot with follow-up.  X-rays of the foot and ankle are independently reviewed by myself today. On the oblique view of the foot there is a subtle cortical change which may be residual secondary to her prior fifth metatarsal fracture. Clinically the patient is nontender in this area.  The remainder of the foot and ankle x-rays from 2 weeks ago are unremarkable. Medial and lateral malleoli are intact. The mortise is preserved. The talus is intact. All metatarsals appear normal. Electronically Signed  By: Hannah BeatSpencer Margaret Edsall, MD On: 01/15/2016 4:36 PM   Past Medical History, Surgical History, Social History, Family History, Problem List, Medications, and Allergies have been reviewed and updated if relevant.  Patient Active Problem List   Diagnosis Date Noted  . Pain in joint, ankle and foot 01/01/2016  . Cough 04/06/2013  . Migraine without aura 01/25/2013    Past Medical History  Diagnosis Date  . Migraine   . Asthma     esp in childhood  . Allergy     Past Surgical History  Procedure Laterality Date  . No past surgeries      Social History   Social History  . Marital Status: Single    Spouse Name: N/A  .  Number of Children: N/A  . Years of Education: N/A   Occupational History  . Not on file.   Social History Main Topics  . Smoking status: Never Smoker   . Smokeless tobacco: Never Used  . Alcohol Use: No  . Drug Use: No  . Sexual Activity: Not on file   Other Topics Concern  . 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  No Known Allergies  Medication list reviewed and updated in full in [facility].  GEN: No fevers, chills. Nontoxic. Primarily MSK c/o today. MSK: Detailed in the HPI GI: tolerating PO intake without difficulty Neuro: No numbness, parasthesias, or tingling associated. Otherwise the pertinent positives of the ROS are noted above.   Objective:   BP 118/76 mmHg  Pulse 73  Temp(Src) 97.4 F (36.3 C) (Oral)  Ht 5\' 3"  (1.6 m)  Wt 183 lb 12 oz (83.348 kg)  BMI 32.56 kg/m2  SpO2 98%  LMP 12/29/2015   GEN: WDWN, NAD, Non-toxic, Alert & Oriented x 3 HEENT: Atraumatic, Normocephalic.  Ears and Nose: No external deformity. EXTR: No clubbing/cyanosis/edema NEURO: Normal gait.  PSYCH: Normally interactive. Conversant. Not depressed or anxious appearing.  Calm demeanor.   FEET: L Echymosis: no Edema: no ROM: Mildly restricted in inversion  and fever as well as plantar flexion and dorsiflexion Gait: heel toe, antalgic MT pain: no Callus pattern: none Lateral Mall: NT Medial Mall: NT Talus: NT Navicular: NT Cuboid: NT Calcaneous: NT Metatarsals: NT 5th MT: NT Phalanges: NT Achilles: NT Plantar Fascia: NT Fat Pad: NT Peroneals: Tender throughout the entire length of the peroneal tendon from its attachment as it wraps around the lateral malleolus and up into the lateral leg. Post Tib: NT Great Toe: Nml motion Ant Drawer: neg ATFL: TTP CFL: TTP Deltoid: NT Other foot breakdown: none Long arch:  preserved Transverse arch: preserved Hindfoot breakdown: none Sensation: intact   Radiology: Dg Ankle Complete Left  01/02/2016  CLINICAL DATA:  25 year old female with left foot and ankle pain (not otherwise specified at the time of this report). EXAM: LEFT ANKLE COMPLETE - 3+ VIEW COMPARISON:  Left foot series from today reported separately. Left foot series [id number] and earlier. FINDINGS: The AP and lateral views are mildly oblique. Mortise joint alignment appears preserved. No ankle joint effusion identified. Talar dome intact. Calcaneus intact. Normal bone mineralization. Distal tibia and fibula appear intact. Stable mild cortical irregularity at the base of the fifth metatarsal. No acute osseous abnormality identified. IMPRESSION: No osseous abnormality identified at the left ankle. Electronically Signed   By: Odessa FlemingH  Hall M.D.   On: 01/02/2016 08:23   Dg Foot Complete Left  01/02/2016  CLINICAL DATA:  Left foot and ankle pain. EXAM: LEFT FOOT - COMPLETE 3+ VIEW COMPARISON:  04/10/2015 . FINDINGS: No acute bony or joint abnormality identified. No evidence of fracture or dislocation. IMPRESSION: No acute or focal abnormality. Electronically Signed   By: Maisie Fushomas  Register   On: 01/02/2016 08:22    Assessment and Plan:   Sprain of anterior talofibular ligament of left ankle, initial encounter  Left foot pain - Plan: CANCELED: DG Foot Complete Left  Calcaneofibular (ligament) ankle sprain, left, initial encounter  Peroneal tendonitis, left  Clinically, she feels like she is improving.  Primary pain at this point is at the ATFL and CFL ligaments. She also has some significant pain at the peroneal tendon attachment and along its entire course. Cannot exclude injury to the peroneal tendon itself, but its function is preserved on examination.  Continue with immobilization for 1 more week, then transition out ASO ankle brace, which the patient has a home.  Suspicious that prior fifth metatarsal  fracture increased predisposition for ankle sprain with decreased proprioception and some instability.  Follow-up: 1 month  Signed,  Channa Hazelett T. Zetha Kuhar, MD   Patient's Medications  New Prescriptions   No medications on file  Previous Medications   CYCLOBENZAPRINE (FLEXERIL) 5 MG TABLET    Take 0.5-1 tablets (2.5-5 mg total) by mouth daily as needed (headache).   IBUPROFEN (ADVIL,MOTRIN) 200 MG TABLET    Take 200 mg by mouth every 6 (six) hours as needed.   SUMATRIPTAN (IMITREX) 100 MG TABLET    Take 1 tablet (100 mg total) by mouth daily as needed for migraine (limit use as much as possible).  Modified Medications   No medications on file  Discontinued Medications   No medications on file

## 2016-01-15 NOTE — Progress Notes (Signed)
Pre visit review using our clinic review tool, if applicable. No additional management support is needed unless otherwise documented below in the visit note. 

## 2016-03-01 IMAGING — CR DG FOOT COMPLETE 3+V*L*
3 series · 3 of 3 positions shown · non-contrast
Comparison: Left foot films of 03/07/2015 and 02/28/2015

CLINICAL DATA: History of fracture of the left fifth metatarsal a
follow

EXAM:
LEFT FOOT - COMPLETE 3+ VIEW

[view not recorded (1 of 3)]
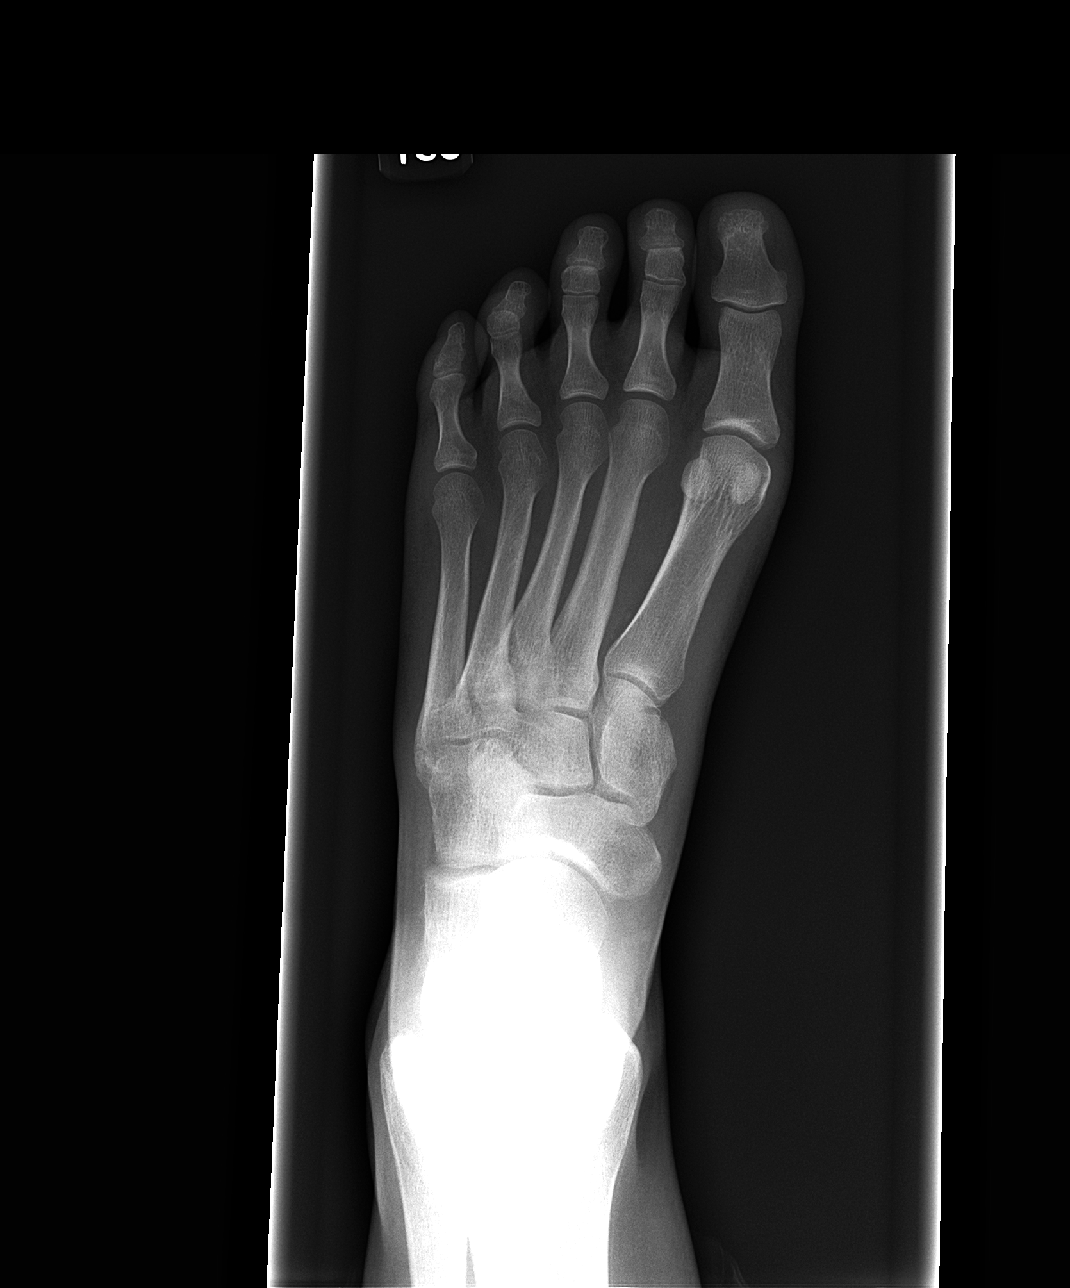

[view not recorded (2 of 3)]
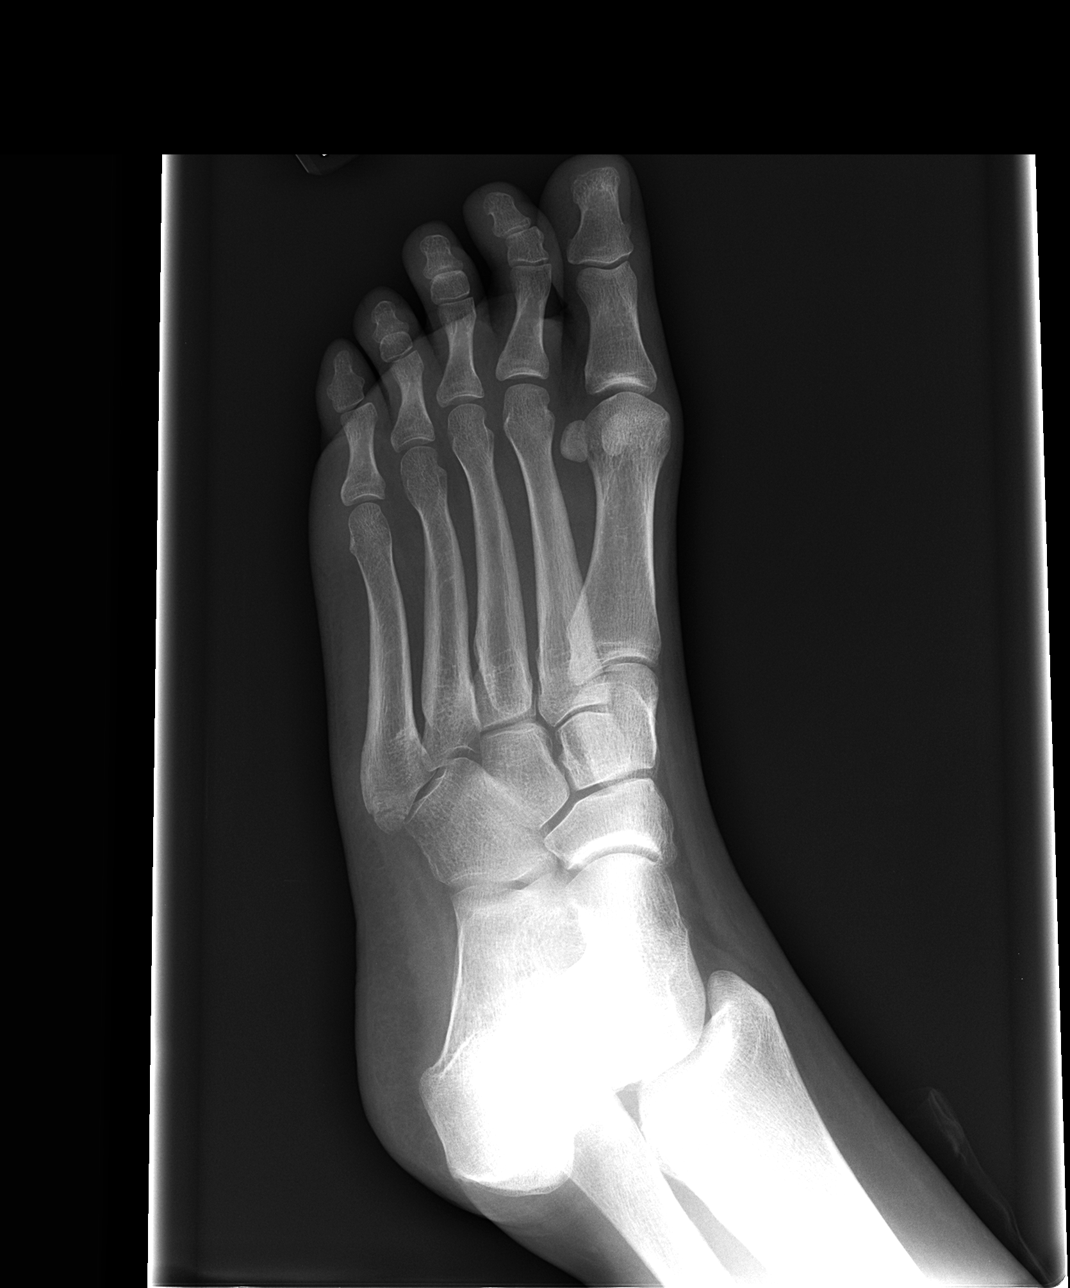

[view not recorded (3 of 3)]
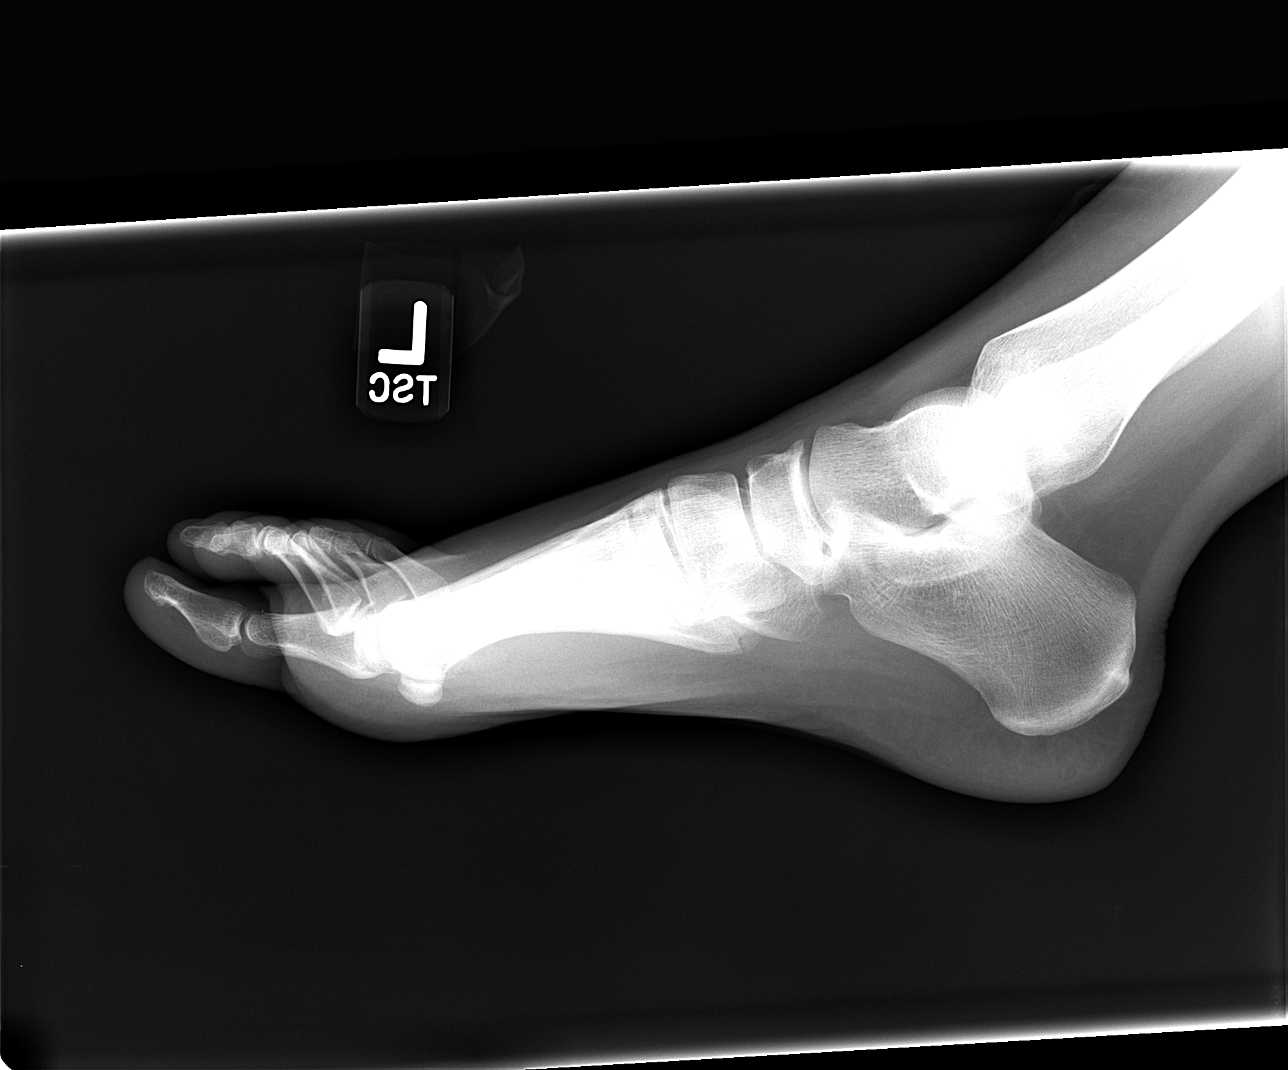

[3 of 3 positions shown; findings below may reference images not displayed]

FINDINGS: There is no change in the nondisplaced fracture through the base of
the left fifth metatarsal. Tarsal metatarsal alignment is normal. No
additional abnormality is seen. Joint spaces appear normal.
IMPRESSION: No significant change in the nondisplaced fracture through the base
of the left fifth metatarsal.

## 2016-03-14 IMAGING — CR DG FOOT COMPLETE 3+V*L*
3 series · 3 of 3 positions shown · non-contrast
Comparison: Radiographs dated 03/28/2015, 03/07/2015 and 02/28/2015

CLINICAL DATA: Fifth metatarsal fracture.

EXAM:
LEFT FOOT - COMPLETE 3+ VIEW

[view not recorded (1 of 3)]
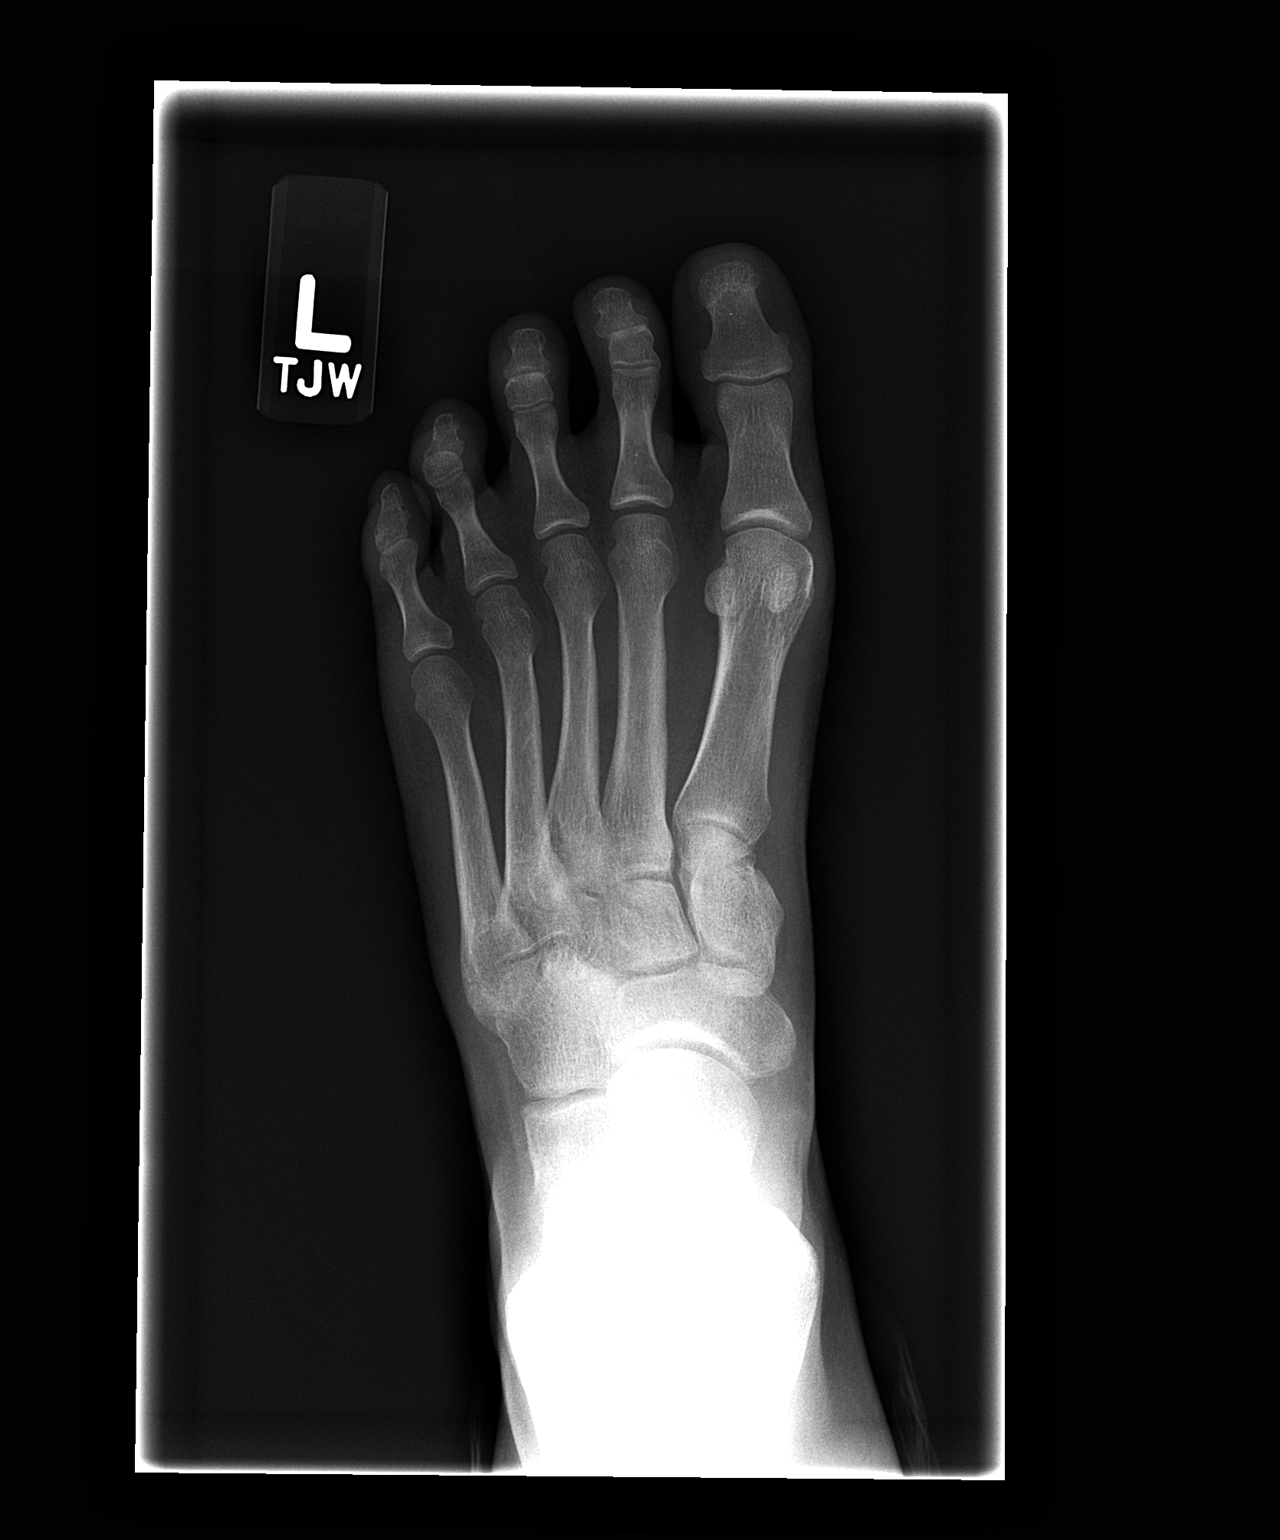

[view not recorded (2 of 3)]
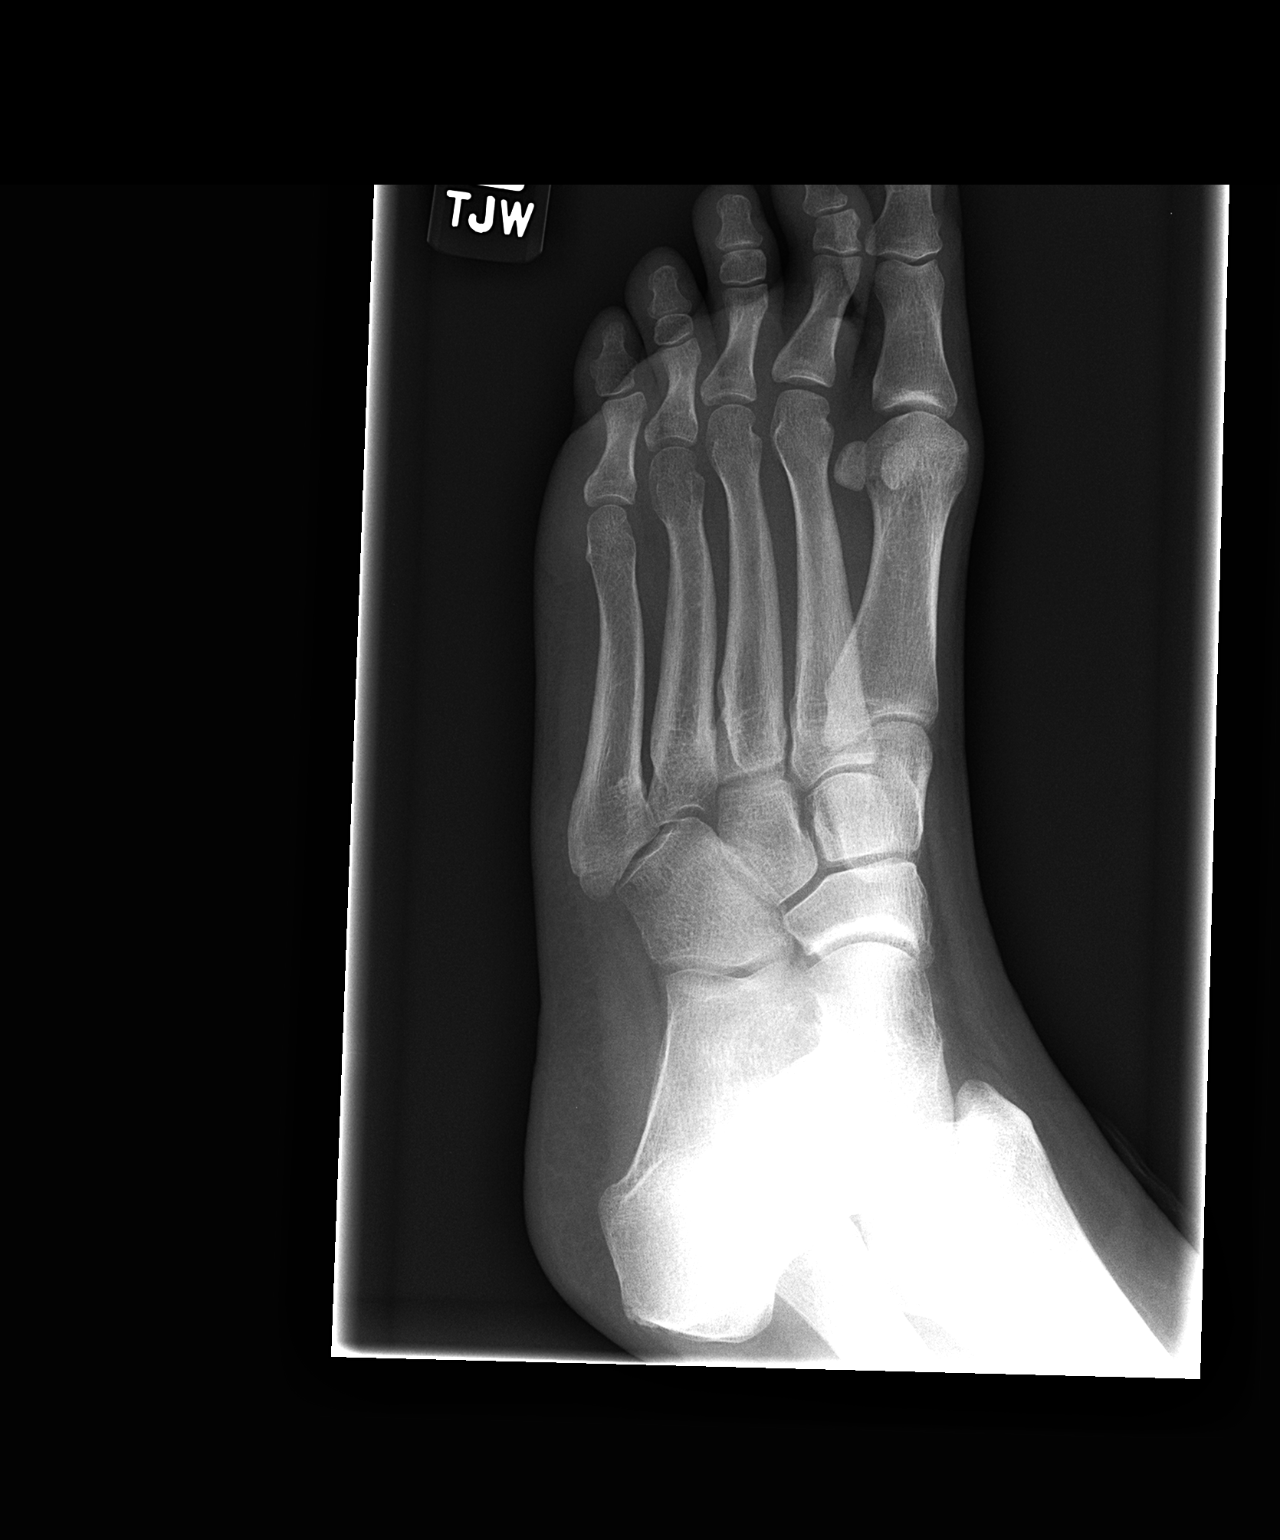

[view not recorded (3 of 3)]
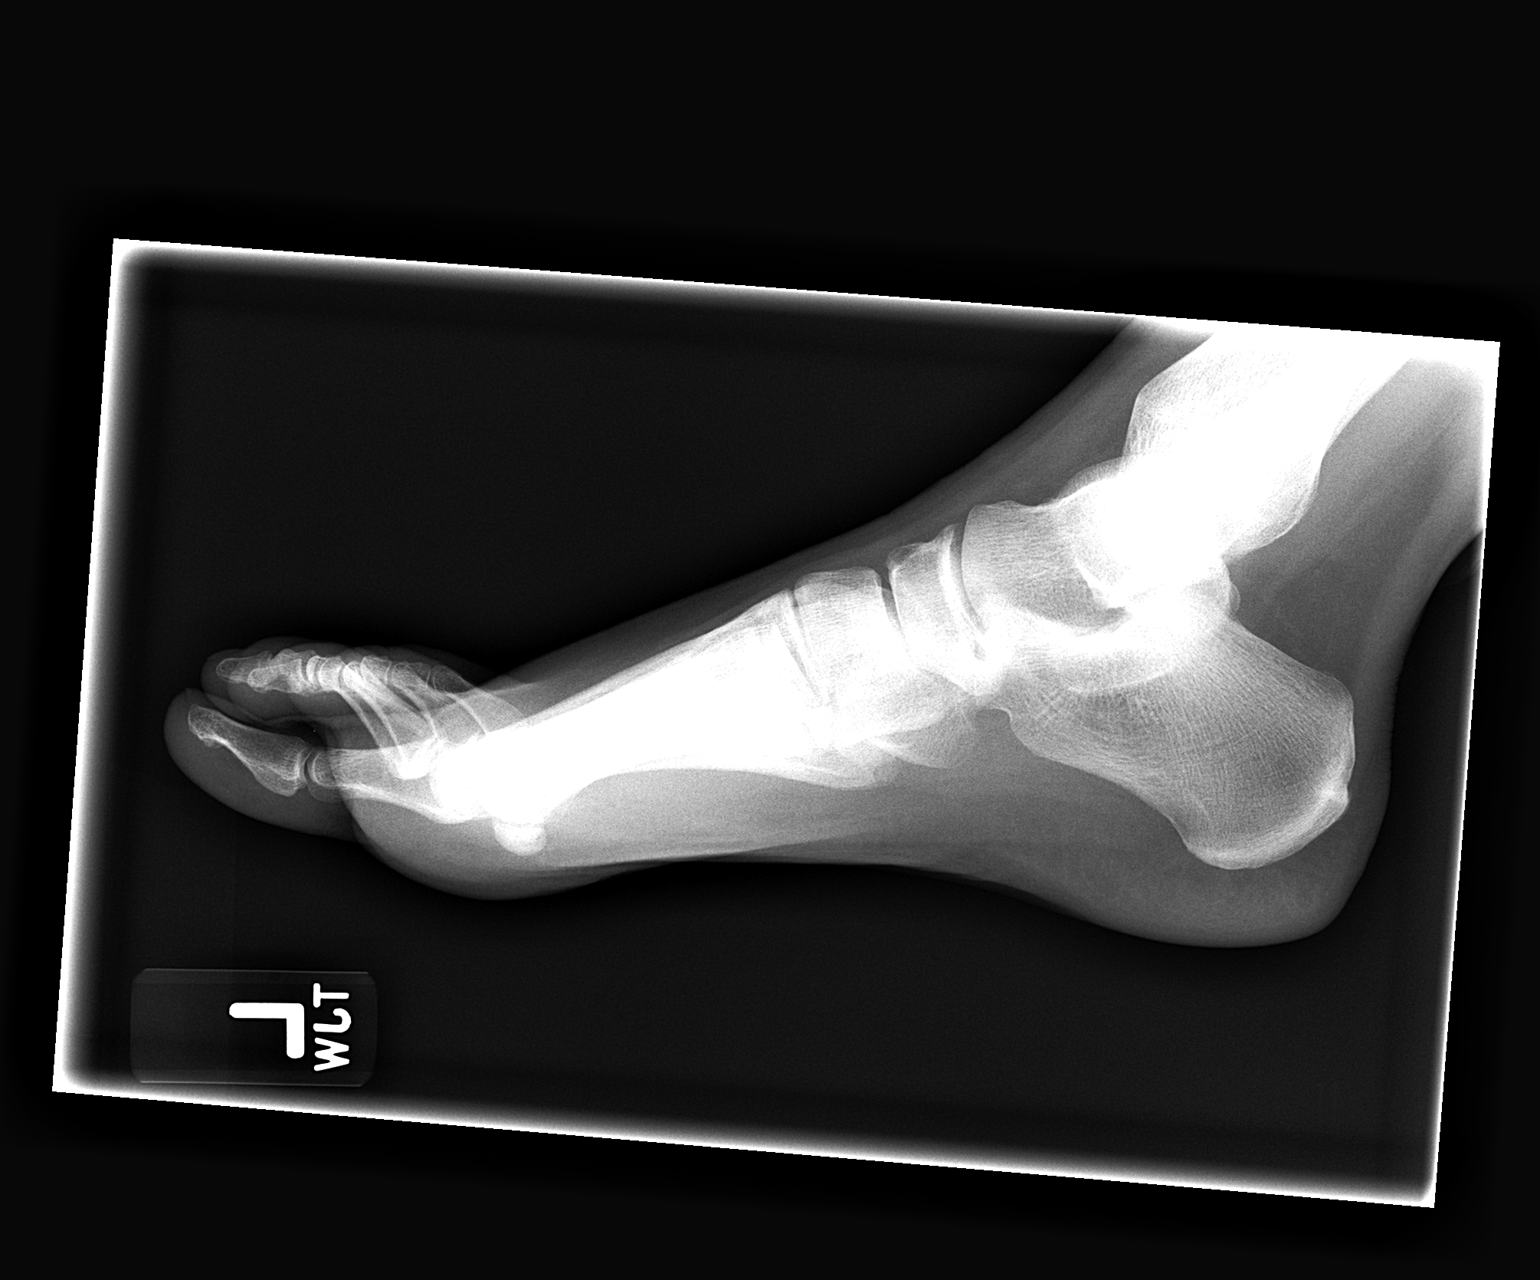

[3 of 3 positions shown; findings below may reference images not displayed]

FINDINGS: The fracture line through the base of the fifth metatarsal is much
less distinct than on the prior studies, indicating partial healing.
The other bones of the foot appear normal.
IMPRESSION: Interval partial healing of the fracture of the base of the fifth
metatarsal.

## 2016-11-24 ENCOUNTER — Ambulatory Visit: Payer: BLUE CROSS/BLUE SHIELD | Admitting: Family Medicine

## 2017-01-13 ENCOUNTER — Encounter: Payer: Self-pay | Admitting: Family Medicine

## 2017-01-13 ENCOUNTER — Ambulatory Visit (INDEPENDENT_AMBULATORY_CARE_PROVIDER_SITE_OTHER): Payer: BLUE CROSS/BLUE SHIELD | Admitting: Family Medicine

## 2017-01-13 VITALS — BP 100/60 | HR 88 | Temp 98.6°F | Wt 180.2 lb

## 2017-01-13 DIAGNOSIS — R350 Frequency of micturition: Secondary | ICD-10-CM | POA: Diagnosis not present

## 2017-01-13 DIAGNOSIS — Z7189 Other specified counseling: Secondary | ICD-10-CM | POA: Diagnosis not present

## 2017-01-13 LAB — POC URINALSYSI DIPSTICK (AUTOMATED)
Bilirubin, UA: NEGATIVE
Blood, UA: NEGATIVE
Glucose, UA: NEGATIVE
Ketones, UA: NEGATIVE
Leukocytes, UA: NEGATIVE
NITRITE UA: NEGATIVE
PH UA: 6 (ref 5.0–8.0)
PROTEIN UA: NEGATIVE
Spec Grav, UA: 1.01 (ref 1.010–1.025)
UROBILINOGEN UA: 0.2 U/dL

## 2017-01-13 MED ORDER — CEPHALEXIN 250 MG PO CAPS: 250.0000 mg | ORAL_CAPSULE | Freq: Two times a day (BID) | ORAL | 0 refills | Status: DC

## 2017-01-13 NOTE — Progress Notes (Signed)
Advance directive d/w pt- would have her mother designated if patient were incapacitated.    R sided pain.  Not present now.  Noted a few days ago.  She took some advil and it got some better in the meantime.  She had urinary frequency.  Pain got better but then returned last night.  Resolved today.  Some burning with urination prev, feeling of incomplete voiding.  No gross hematuria.  No FCNAVD.  No L sided pain.  No h/o renal stones.    Migraines are manageable, usually better then prev.  Some occ flares but manageable.    Meds, vitals, and allergies reviewed.   ROS: Per HPI unless specifically indicated in ROS section   GEN: nad, alert and oriented HEENT: mucous membranes moist NECK: supple w/o LA CV: rrr.  PULM: ctab, no inc wob ABD: soft, +bs, no CVA pain, suprapubic area not ttp EXT: no edema SKIN: no acute rash

## 2017-01-13 NOTE — Patient Instructions (Signed)
Drink plenty of water and hold the antibiotics for now, start if more symptoms.  We'll contact you with your lab report.  Take care.

## 2017-01-14 DIAGNOSIS — Z7189 Other specified counseling: Secondary | ICD-10-CM | POA: Insufficient documentation

## 2017-01-14 DIAGNOSIS — R350 Frequency of micturition: Secondary | ICD-10-CM | POA: Insufficient documentation

## 2017-01-14 NOTE — Assessment & Plan Note (Signed)
Possible cystitis. Discussed with patient. Hold Keflex prescription for now. Start if more dysuria. Check urine culture. Continue adequate fluids by mouth. Nontoxic. She agrees.

## 2017-01-14 NOTE — Assessment & Plan Note (Signed)
Advance directive d/w pt- would have her mother designated if patient were incapacitated.

## 2017-01-15 LAB — URINE CULTURE: Organism ID, Bacteria: NO GROWTH

## 2018-01-28 ENCOUNTER — Encounter: Payer: Self-pay | Admitting: Family Medicine

## 2018-01-28 ENCOUNTER — Ambulatory Visit: Payer: BLUE CROSS/BLUE SHIELD | Admitting: Family Medicine

## 2018-01-28 DIAGNOSIS — J3489 Other specified disorders of nose and nasal sinuses: Secondary | ICD-10-CM

## 2018-01-28 MED ORDER — FLUTICASONE PROPIONATE 50 MCG/ACT NA SUSP: 1.0000 | Freq: Every day | NASAL | Status: DC

## 2018-01-28 MED ORDER — AMOXICILLIN-POT CLAVULANATE 875-125 MG PO TABS: 1.0000 | ORAL_TABLET | Freq: Two times a day (BID) | ORAL | 0 refills | Status: DC

## 2018-01-28 MED ORDER — CYCLOBENZAPRINE HCL 5 MG PO TABS: 2.5000 mg | ORAL_TABLET | Freq: Every day | ORAL | 5 refills | Status: DC | PRN

## 2018-01-28 NOTE — Patient Instructions (Signed)
Start the antibiotics and use flonase and try to get some rest.  Take care.  Glad to see you.  Update me as needed.

## 2018-01-28 NOTE — Progress Notes (Signed)
duration of symptoms: a few days  Rhinorrhea: yes Congestion: yes ear pain: yes, B sore throat: yes- this was initial sx.   Cough: yes Myalgias: no No vomiting.   Facial pain: yes.  R ear with sig pain.   LMP 01/23/18  She needed refill on flexeril.  She got some relief from HA- had more need during allergy season.    Per HPI unless specifically indicated in ROS section   Meds, vitals, and allergies reviewed.   GEN: nad, alert and oriented HEENT: mucous membranes moist, TM w/o erythema, nasal epithelium injected, OP with cobblestoning NECK: supple w/o LA CV: rrr. PULM: ctab, no inc wob ABD: soft, +bs EXT: no edema R>L max and frontal sinuses ttp

## 2018-01-30 DIAGNOSIS — J3489 Other specified disorders of nose and nasal sinuses: Secondary | ICD-10-CM | POA: Insufficient documentation

## 2018-01-30 NOTE — Assessment & Plan Note (Signed)
Presumed sinusitis.  Nontoxic.  Start Flonase.  Start Augmentin.  Rest and fluids.  Supportive care.  Update me as needed.  She agrees.  Routine cautions about condition and medications discussed with patient.

## 2018-02-03 ENCOUNTER — Other Ambulatory Visit: Payer: Self-pay | Admitting: *Deleted

## 2018-02-03 MED ORDER — CYCLOBENZAPRINE HCL 5 MG PO TABS: 2.5000 mg | ORAL_TABLET | Freq: Every day | ORAL | 5 refills | Status: DC | PRN

## 2018-07-14 ENCOUNTER — Encounter: Payer: Self-pay | Admitting: Family Medicine

## 2018-07-14 ENCOUNTER — Ambulatory Visit (INDEPENDENT_AMBULATORY_CARE_PROVIDER_SITE_OTHER): Payer: BLUE CROSS/BLUE SHIELD | Admitting: Family Medicine

## 2018-07-14 VITALS — BP 116/70 | HR 87 | Temp 98.2°F | Ht 63.0 in | Wt 198.5 lb

## 2018-07-14 DIAGNOSIS — J040 Acute laryngitis: Secondary | ICD-10-CM | POA: Insufficient documentation

## 2018-07-14 MED ORDER — GUAIFENESIN-CODEINE 100-10 MG/5ML PO SYRP: 5.0000 mL | ORAL_SOLUTION | Freq: Four times a day (QID) | ORAL | 0 refills | Status: DC | PRN

## 2018-07-14 MED ORDER — BENZONATATE 200 MG PO CAPS: 200.0000 mg | ORAL_CAPSULE | Freq: Three times a day (TID) | ORAL | 1 refills | Status: DC | PRN

## 2018-07-14 MED ORDER — PREDNISONE 10 MG PO TABS: ORAL_TABLET | ORAL | 0 refills | Status: DC

## 2018-07-14 NOTE — Assessment & Plan Note (Signed)
With viral uri  Given px (printed) for prednisone taper to fill if wheeze worsens Reassuring exam  Fluids Rest voice Tessalon and robitussin ac (caution of sedation) for cough prn  Update if not starting to improve in a week or if worsening

## 2018-07-14 NOTE — Patient Instructions (Addendum)
Drink lots of fluids  Rest your voice   Watch for worse wheezing- fill prednisone if needed   Try tessalon for cough  Try codeine cough medicine when not working or driving for more severe cough   Update if not starting to improve in a week or if worsening

## 2018-07-14 NOTE — Progress Notes (Signed)
Subjective:    Patient ID: Margaret Cunningham, female    DOB: 1990-08-06, 27 y.o.   MRN: [id number]  HPI 27 yo pt of Dr Para Marchuncan here with c/o hoarseness  Throat is sore (from trying to talk) on and off  Started 12/24 Very hoarse  Cough- prod in am only - yellow  No wheezing   Has hx of asthma  Has not needed an inhaler   Some stuffy nose  No sinus pain    Had a cold early this mo  Got better then worse   Otc: advil  No fever   Patient Active Problem List   Diagnosis Date Noted  . Laryngitis 07/14/2018  . Sinus pain 01/30/2018  . Advance care planning 01/14/2017  . Urine frequency 01/14/2017  . Pain in joint, ankle and foot 01/01/2016  . Cough 04/06/2013  . Migraine without aura 01/25/2013   Past Medical History:  Diagnosis Date  . Allergy   . Asthma    esp in childhood  . Migraine    Past Surgical History:  Procedure Laterality Date  . NO PAST SURGERIES     Social History   Tobacco Use  . Smoking status: Never Smoker  . Smokeless tobacco: Never Used  Substance Use Topics  . Alcohol use: No    Alcohol/week: 0.0 standard drinks  . Drug use: No   History reviewed. No pertinent family history. No Known Allergies Current Outpatient Medications on File Prior to Visit  Medication Sig Dispense Refill  . cyclobenzaprine (FLEXERIL) 5 MG tablet Take 0.5-1 tablets (2.5-5 mg total) by mouth daily as needed (headache). 30 tablet 5  . fluticasone (FLONASE) 50 MCG/ACT nasal spray Place 1-2 sprays into both nostrils daily.    Marland Kitchen. ibuprofen (ADVIL,MOTRIN) 200 MG tablet Take 200 mg by mouth every 6 (six) hours as needed.    . Prenatal MV-Min-Fe Fum-FA-DHA (PRENATAL 1 PO) Take by mouth daily.    . SUMAtriptan (IMITREX) 100 MG tablet Take 1 tablet (100 mg total) by mouth daily as needed for migraine (limit use as much as possible). 10 tablet 1   No current facility-administered medications on file prior to visit.      Review of Systems  Constitutional: Positive for  appetite change and fatigue. Negative for fever.  HENT: Positive for congestion, postnasal drip, rhinorrhea, sinus pressure, sneezing, sore throat and voice change. Negative for ear pain.   Eyes: Negative for pain and discharge.  Respiratory: Positive for cough and chest tightness. Negative for shortness of breath, wheezing and stridor.   Cardiovascular: Negative for chest pain.  Gastrointestinal: Negative for diarrhea, nausea and vomiting.  Genitourinary: Negative for frequency, hematuria and urgency.  Musculoskeletal: Negative for arthralgias and myalgias.  Skin: Negative for rash.  Neurological: Positive for headaches. Negative for dizziness, weakness and light-headedness.  Psychiatric/Behavioral: Negative for confusion and dysphoric mood.       Objective:   Physical Exam Constitutional:      General: She is not in acute distress.    Appearance: Normal appearance. She is well-developed. She is obese. She is not ill-appearing, toxic-appearing or diaphoretic.     Comments: Very hoarse voice  HENT:     Head: Normocephalic and atraumatic.     Comments: Nares are injected and congested    No sinus tenderness    Right Ear: Tympanic membrane, ear canal and external ear normal.     Left Ear: Tympanic membrane, ear canal and external ear normal.     Nose: Congestion  and rhinorrhea present.     Mouth/Throat:     Mouth: Mucous membranes are moist.     Pharynx: Oropharynx is clear. No oropharyngeal exudate or posterior oropharyngeal erythema.     Comments: Clear pnd  Eyes:     General:        Right eye: No discharge.        Left eye: No discharge.     Conjunctiva/sclera: Conjunctivae normal.     Pupils: Pupils are equal, round, and reactive to light.  Neck:     Musculoskeletal: Normal range of motion and neck supple.  Cardiovascular:     Rate and Rhythm: Normal rate.     Heart sounds: Normal heart sounds.  Pulmonary:     Effort: Pulmonary effort is normal. No respiratory distress.       Breath sounds: Normal breath sounds. No stridor. No wheezing, rhonchi or rales.     Comments: Harsh bs Good air exch Scant wheeze on forced exp only  No rales or rhonchi  Chest:     Chest wall: No tenderness.  Lymphadenopathy:     Cervical: No cervical adenopathy.  Skin:    General: Skin is warm and dry.     Capillary Refill: Capillary refill takes less than 2 seconds.     Findings: No rash.  Neurological:     Mental Status: She is alert.     Cranial Nerves: No cranial nerve deficit.  Psychiatric:        Mood and Affect: Mood normal.           Assessment & Plan:   Problem List Items Addressed This Visit      Respiratory   Laryngitis - Primary    With viral uri  Given px (printed) for prednisone taper to fill if wheeze worsens Reassuring exam  Fluids Rest voice Tessalon and robitussin ac (caution of sedation) for cough prn  Update if not starting to improve in a week or if worsening

## 2018-07-27 DIAGNOSIS — Z01419 Encounter for gynecological examination (general) (routine) without abnormal findings: Secondary | ICD-10-CM | POA: Diagnosis not present

## 2018-07-27 DIAGNOSIS — Z6835 Body mass index (BMI) 35.0-35.9, adult: Secondary | ICD-10-CM | POA: Diagnosis not present

## 2018-07-27 DIAGNOSIS — Z124 Encounter for screening for malignant neoplasm of cervix: Secondary | ICD-10-CM | POA: Diagnosis not present

## 2018-07-27 LAB — HM PAP SMEAR

## 2018-08-12 ENCOUNTER — Encounter: Payer: Self-pay | Admitting: Family Medicine

## 2018-08-22 ENCOUNTER — Ambulatory Visit: Payer: BLUE CROSS/BLUE SHIELD | Admitting: Family Medicine

## 2018-08-22 ENCOUNTER — Encounter: Payer: Self-pay | Admitting: Family Medicine

## 2018-08-22 VITALS — BP 104/70 | HR 92 | Temp 98.6°F | Ht 63.0 in

## 2018-08-22 DIAGNOSIS — J029 Acute pharyngitis, unspecified: Secondary | ICD-10-CM

## 2018-08-22 LAB — POCT RAPID STREP A (OFFICE): Rapid Strep A Screen: NEGATIVE

## 2018-08-22 MED ORDER — HYDROCOD POLST-CPM POLST ER 10-8 MG/5ML PO SUER: 5.0000 mL | Freq: Two times a day (BID) | ORAL | 0 refills | Status: DC | PRN

## 2018-08-22 NOTE — Progress Notes (Signed)
She had hoarse voice on 07/12/18.  She felt jittery with prednisone.  Voice never totally returned to normal but got some better. That was her baseline until 08/18/2018 with fever at that point.  Mult sick contacts.  Cough.  Stuffy nose. No vomiting.  Some ear pressure but she flew back from [place].  No aches.  She didn't have severe enough sx to think she had the flu.    Meds, vitals, and allergies reviewed.   ROS: Per HPI unless specifically indicated in ROS section   GEN: nad, alert and oriented HEENT: mucous membranes moist, tm w/o erythema, nasal exam w/o erythema, clear discharge noted,  OP with cobblestoning but no exudates. NECK: supple. She has tender LA on the R side.  CV: rrr.   PULM: ctab, no inc wob EXT: no edema SKIN: no acute rash

## 2018-08-22 NOTE — Patient Instructions (Signed)
Try to rest your voice.  Continue flonase.   Use tussionex if needed for the cough.  Sedation caution.  Tylenol or ibuprofen for the aches . Update [place] as needed.  Take care.  Glad to see you.

## 2018-08-24 DIAGNOSIS — J029 Acute pharyngitis, unspecified: Secondary | ICD-10-CM | POA: Insufficient documentation

## 2018-08-24 NOTE — Assessment & Plan Note (Signed)
RST neg.   Most bothered by cough.   Likely viral issue.   Try to rest voice.  Continue flonase.   Use tussionex if needed for the cough.  Sedation caution.  Tylenol or ibuprofen for the aches . Update [place] as needed.  She agrees.  Nontoxic.

## 2018-09-29 DIAGNOSIS — N979 Female infertility, unspecified: Secondary | ICD-10-CM | POA: Diagnosis not present

## 2018-09-29 DIAGNOSIS — Z6835 Body mass index (BMI) 35.0-35.9, adult: Secondary | ICD-10-CM | POA: Diagnosis not present

## 2019-02-06 ENCOUNTER — Other Ambulatory Visit (HOSPITAL_COMMUNITY)
Admission: RE | Admit: 2019-02-06 | Discharge: 2019-02-06 | Disposition: A | Discharge status: Home / Self Care | Payer: BC Managed Care – PPO | Source: Ambulatory Visit | Attending: Obstetrics and Gynecology | Admitting: Obstetrics and Gynecology

## 2019-02-06 DIAGNOSIS — Z1159 Encounter for screening for other viral diseases: Secondary | ICD-10-CM | POA: Diagnosis not present

## 2019-02-06 LAB — SARS CORONAVIRUS 2 (TAT 6-24 HRS): SARS Coronavirus 2: NEGATIVE

## 2019-02-07 ENCOUNTER — Other Ambulatory Visit: Payer: Self-pay

## 2019-02-07 ENCOUNTER — Encounter (HOSPITAL_BASED_OUTPATIENT_CLINIC_OR_DEPARTMENT_OTHER): Payer: Self-pay | Admitting: *Deleted

## 2019-02-07 NOTE — Progress Notes (Signed)
Spoke with patient via telephone for pre op interview. NPO after MN. No medications AM of surgery. Will need UPT AM of surgery. Arrival time 1030.

## 2019-02-09 ENCOUNTER — Encounter (HOSPITAL_BASED_OUTPATIENT_CLINIC_OR_DEPARTMENT_OTHER)
Admission: RE | Disposition: A | Discharge status: Home / Self Care | Payer: Self-pay | Source: Home / Self Care | Attending: Obstetrics and Gynecology

## 2019-02-09 ENCOUNTER — Other Ambulatory Visit: Payer: Self-pay

## 2019-02-09 ENCOUNTER — Ambulatory Visit (HOSPITAL_BASED_OUTPATIENT_CLINIC_OR_DEPARTMENT_OTHER)
Admission: RE | Admit: 2019-02-09 | Discharge: 2019-02-09 | Disposition: A | Discharge status: Home / Self Care | Payer: BC Managed Care – PPO | Attending: Obstetrics and Gynecology | Admitting: Obstetrics and Gynecology

## 2019-02-09 ENCOUNTER — Encounter (HOSPITAL_BASED_OUTPATIENT_CLINIC_OR_DEPARTMENT_OTHER): Payer: Self-pay | Admitting: Emergency Medicine

## 2019-02-09 ENCOUNTER — Ambulatory Visit (HOSPITAL_BASED_OUTPATIENT_CLINIC_OR_DEPARTMENT_OTHER): Payer: BC Managed Care – PPO | Admitting: Anesthesiology

## 2019-02-09 DIAGNOSIS — N939 Abnormal uterine and vaginal bleeding, unspecified: Secondary | ICD-10-CM | POA: Insufficient documentation

## 2019-02-09 DIAGNOSIS — N840 Polyp of corpus uteri: Secondary | ICD-10-CM | POA: Diagnosis not present

## 2019-02-09 DIAGNOSIS — G43909 Migraine, unspecified, not intractable, without status migrainosus: Secondary | ICD-10-CM | POA: Diagnosis not present

## 2019-02-09 DIAGNOSIS — Z888 Allergy status to other drugs, medicaments and biological substances status: Secondary | ICD-10-CM | POA: Diagnosis not present

## 2019-02-09 DIAGNOSIS — G43009 Migraine without aura, not intractable, without status migrainosus: Secondary | ICD-10-CM | POA: Diagnosis not present

## 2019-02-09 HISTORY — PX: CERVICAL POLYPECTOMY: SHX88

## 2019-02-09 HISTORY — PX: HYSTEROSCOPY WITH D & C: SHX1775

## 2019-02-09 LAB — ABO/RH: ABO/RH(D): A POS

## 2019-02-09 LAB — TYPE AND SCREEN
ABO/RH(D): A POS
Antibody Screen: NEGATIVE

## 2019-02-09 LAB — POCT PREGNANCY, URINE: Preg Test, Ur: NEGATIVE

## 2019-02-09 SURGERY — DILATATION AND CURETTAGE /HYSTEROSCOPY
Anesthesia: General

## 2019-02-09 MED ORDER — MIDAZOLAM HCL 5 MG/5ML IJ SOLN
INTRAMUSCULAR | Status: DC | PRN
  Administered 2019-02-09: 2 mg via INTRAVENOUS

## 2019-02-09 MED ORDER — OXYCODONE HCL 5 MG PO TABS
5.0000 mg | ORAL_TABLET | Freq: Once | ORAL | Status: DC | PRN
  Filled 2019-02-09: qty 1

## 2019-02-09 MED ORDER — LIDOCAINE HCL 1 % IJ SOLN
INTRAMUSCULAR | Status: DC | PRN
  Administered 2019-02-09: 10 mL

## 2019-02-09 MED ORDER — DEXAMETHASONE SODIUM PHOSPHATE 10 MG/ML IJ SOLN
INTRAMUSCULAR | Status: AC
  Filled 2019-02-09: qty 1

## 2019-02-09 MED ORDER — LACTATED RINGERS IV SOLN
INTRAVENOUS | Status: DC
  Administered 2019-02-09: 11:00:00 via INTRAVENOUS
  Filled 2019-02-09: qty 1000

## 2019-02-09 MED ORDER — ACETAMINOPHEN 160 MG/5ML PO SOLN
325.0000 mg | ORAL | Status: DC | PRN
  Filled 2019-02-09: qty 20.3

## 2019-02-09 MED ORDER — KETOROLAC TROMETHAMINE 30 MG/ML IJ SOLN
INTRAMUSCULAR | Status: AC
  Filled 2019-02-09: qty 1

## 2019-02-09 MED ORDER — KETOROLAC TROMETHAMINE 30 MG/ML IJ SOLN
30.0000 mg | Freq: Once | INTRAMUSCULAR | Status: DC | PRN
  Filled 2019-02-09: qty 1

## 2019-02-09 MED ORDER — LIDOCAINE 2% (20 MG/ML) 5 ML SYRINGE
INTRAMUSCULAR | Status: AC
  Filled 2019-02-09: qty 5

## 2019-02-09 MED ORDER — PROPOFOL 10 MG/ML IV BOLUS
INTRAVENOUS | Status: DC | PRN
  Administered 2019-02-09: 200 mg via INTRAVENOUS

## 2019-02-09 MED ORDER — FENTANYL CITRATE (PF) 100 MCG/2ML IJ SOLN
INTRAMUSCULAR | Status: AC
  Filled 2019-02-09: qty 2

## 2019-02-09 MED ORDER — ONDANSETRON HCL 4 MG/2ML IJ SOLN
INTRAMUSCULAR | Status: AC
  Filled 2019-02-09: qty 2

## 2019-02-09 MED ORDER — LIDOCAINE HCL (CARDIAC) PF 100 MG/5ML IV SOSY
PREFILLED_SYRINGE | INTRAVENOUS | Status: DC | PRN
  Administered 2019-02-09: 50 mg via INTRAVENOUS

## 2019-02-09 MED ORDER — DEXAMETHASONE SODIUM PHOSPHATE 4 MG/ML IJ SOLN
INTRAMUSCULAR | Status: DC | PRN
  Administered 2019-02-09: 5 mg via INTRAVENOUS

## 2019-02-09 MED ORDER — FENTANYL CITRATE (PF) 100 MCG/2ML IJ SOLN
25.0000 ug | INTRAMUSCULAR | Status: DC | PRN
  Filled 2019-02-09: qty 1

## 2019-02-09 MED ORDER — LACTATED RINGERS IV SOLN
INTRAVENOUS | Status: DC
  Administered 2019-02-09: 12:00:00 via INTRAVENOUS
  Filled 2019-02-09: qty 1000

## 2019-02-09 MED ORDER — ONDANSETRON HCL 4 MG/2ML IJ SOLN
4.0000 mg | Freq: Once | INTRAMUSCULAR | Status: DC | PRN
  Filled 2019-02-09: qty 2

## 2019-02-09 MED ORDER — ACETAMINOPHEN 325 MG PO TABS
325.0000 mg | ORAL_TABLET | ORAL | Status: DC | PRN
  Filled 2019-02-09: qty 2

## 2019-02-09 MED ORDER — PROPOFOL 10 MG/ML IV BOLUS
INTRAVENOUS | Status: AC
  Filled 2019-02-09: qty 20

## 2019-02-09 MED ORDER — MEPERIDINE HCL 25 MG/ML IJ SOLN
6.2500 mg | INTRAMUSCULAR | Status: DC | PRN
  Filled 2019-02-09: qty 1

## 2019-02-09 MED ORDER — SODIUM CHLORIDE 0.9 % IR SOLN
Status: DC | PRN
  Administered 2019-02-09: 3000 mL

## 2019-02-09 MED ORDER — FENTANYL CITRATE (PF) 100 MCG/2ML IJ SOLN
INTRAMUSCULAR | Status: DC | PRN
  Administered 2019-02-09: 50 ug via INTRAVENOUS

## 2019-02-09 MED ORDER — MIDAZOLAM HCL 2 MG/2ML IJ SOLN
INTRAMUSCULAR | Status: AC
  Filled 2019-02-09: qty 2

## 2019-02-09 MED ORDER — OXYCODONE HCL 5 MG/5ML PO SOLN
5.0000 mg | Freq: Once | ORAL | Status: DC | PRN
  Filled 2019-02-09: qty 5

## 2019-02-09 MED ORDER — HYDROCODONE-ACETAMINOPHEN 5-325 MG PO TABS: 1.0000 | ORAL_TABLET | Freq: Four times a day (QID) | ORAL | 0 refills | Status: DC | PRN

## 2019-02-09 MED ORDER — KETOROLAC TROMETHAMINE 30 MG/ML IJ SOLN
INTRAMUSCULAR | Status: DC | PRN
  Administered 2019-02-09: 30 mg via INTRAVENOUS

## 2019-02-09 MED ORDER — ONDANSETRON HCL 4 MG/2ML IJ SOLN
INTRAMUSCULAR | Status: DC | PRN
  Administered 2019-02-09: 4 mg via INTRAVENOUS

## 2019-02-09 SURGICAL SUPPLY — 19 items
BIPOLAR CUTTING LOOP 21FR (ELECTRODE)
CANISTER SUCT 3000ML PPV (MISCELLANEOUS) ×2 IMPLANT
CATH ROBINSON RED A/P 16FR (CATHETERS) ×2 IMPLANT
COVER WAND RF STERILE (DRAPES) ×2 IMPLANT
DILATOR CANAL MILEX (MISCELLANEOUS) IMPLANT
GLOVE BIO SURGEON STRL SZ 6.5 (GLOVE) ×2 IMPLANT
GLOVE BIO SURGEON STRL SZ7 (GLOVE) ×2 IMPLANT
GLOVE BIOGEL PI IND STRL 6.5 (GLOVE) ×1 IMPLANT
GLOVE BIOGEL PI INDICATOR 6.5 (GLOVE) ×1
GOWN STRL REUS W/TWL LRG LVL3 (GOWN DISPOSABLE) ×4 IMPLANT
IV NS IRRIG 3000ML ARTHROMATIC (IV SOLUTION) ×2 IMPLANT
KIT PROCEDURE FLUENT (KITS) ×2 IMPLANT
KIT TURNOVER CYSTO (KITS) ×2 IMPLANT
LOOP CUTTING BIPOLAR 21FR (ELECTRODE) IMPLANT
PACK VAGINAL MINOR WOMEN LF (CUSTOM PROCEDURE TRAY) ×2 IMPLANT
PAD OB MATERNITY 4.3X12.25 (PERSONAL CARE ITEMS) ×2 IMPLANT
PAD PREP 24X48 CUFFED NSTRL (MISCELLANEOUS) ×2 IMPLANT
TOWEL OR 17X26 10 PK STRL BLUE (TOWEL DISPOSABLE) ×2 IMPLANT
WATER STERILE IRR 500ML POUR (IV SOLUTION) ×2 IMPLANT

## 2019-02-09 NOTE — Discharge Instructions (Signed)

## 2019-02-09 NOTE — Op Note (Signed)
Pre-Operative Diagnosis: 1) Abnormal uterine bleeding, suspected polyp Postoperative Diagnosis: 1) Abnormal uterine bleeding, suspected polyp Procedure: Hysteroscopy, dilation and curettage Surgeon: Dr. Vanessa Kick Assistant: none Operative Findings: Uterus in midline position. Posterior endometrial polyp. Bilateral tubal ostia visualized. Fluid deficit 90 cc Specimen: Endometrial curettings EBL: minimal  Margaret Cunningham Is a 28 year old gravida 0 presents for definitive surgical management for abnormal uterine bleeding and suspected endometrial polyp. Please see the patient's history and physical for complete details of the history. Management options were discussed with the patient. R/B/A reviewed. Following appropriate informed consent was taken to the operating room. The patient was appropriately identified during a time out procedure. General LMA anesthesia was administered and the patient was placed in the dorsal lithotomy position. The patient was prepped and draped in the normal sterile fashion. A speculum was placed into the vagina, a single-tooth tenaculum was placed on the anterior lip of the cervix, and 10 cc of 1% lidocaine was administered in a paracervical fashion. The cervix was serially dilated with Kennon Rounds dilators. The hysteroscope was introduced into the endometrial cavity for the above findings. The hysteroscope was removed and a sharp curettage was performed. A second visualization of the endometrial cavity was performed. An additional small area on the patients left side of the cavity required an additional curettage. This was performed and then the surgical procedure was complete. The patient tolerated the procedure well was brought to the recovery room in stable condition for the procedure. All sponge and needle counts correct

## 2019-02-09 NOTE — Anesthesia Procedure Notes (Signed)
Procedure Name: LMA Insertion Date/Time: 02/09/2019 12:29 PM Performed by: Bufford Spikes, CRNA Pre-anesthesia Checklist: Patient identified, Emergency Drugs available, Suction available and Patient being monitored Patient Re-evaluated:Patient Re-evaluated prior to induction Oxygen Delivery Method: Circle system utilized Preoxygenation: Pre-oxygenation with 100% oxygen Induction Type: IV induction Ventilation: Mask ventilation without difficulty LMA: LMA inserted LMA Size: 4.0 Number of attempts: 1 Airway Equipment and Method: Bite block Placement Confirmation: positive ETCO2 Tube secured with: Tape Dental Injury: Teeth and Oropharynx as per pre-operative assessment

## 2019-02-09 NOTE — Anesthesia Preprocedure Evaluation (Signed)
Anesthesia Evaluation  Patient identified by MRN, date of birth, ID band Patient awake    Reviewed: Allergy & Precautions, NPO status , Patient's Chart, lab work & pertinent test results  Airway Mallampati: I       Dental no notable dental hx. (+) Teeth Intact   Pulmonary    Pulmonary exam normal breath sounds clear to auscultation       Cardiovascular Normal cardiovascular exam Rhythm:Regular Rate:Normal     Neuro/Psych  Headaches, negative psych ROS   GI/Hepatic negative GI ROS, Neg liver ROS,   Endo/Other  negative endocrine ROS  Renal/GU negative Renal ROS  negative genitourinary   Musculoskeletal negative musculoskeletal ROS (+)   Abdominal (+) + obese,   Peds negative pediatric ROS (+)  Hematology negative hematology ROS (+)   Anesthesia Other Findings   Reproductive/Obstetrics negative OB ROS                             Anesthesia Physical Anesthesia Plan  ASA: II  Anesthesia Plan: General   Post-op Pain Management:    Induction: Intravenous  PONV Risk Score and Plan: 4 or greater and Ondansetron, Dexamethasone, Midazolam and Scopolamine patch - Pre-op  Airway Management Planned: LMA  Additional Equipment:   Intra-op Plan:   Post-operative Plan: Extubation in OR  Informed Consent: I have reviewed the patients History and Physical, chart, labs and discussed the procedure including the risks, benefits and alternatives for the proposed anesthesia with the patient or authorized representative who has indicated his/her understanding and acceptance.     Dental advisory given  Plan Discussed with: CRNA  Anesthesia Plan Comments:         Anesthesia Quick Evaluation

## 2019-02-09 NOTE — H&P (Signed)
Margaret Cunningham is an 28 y.o. female.  28 yo G0 who presents for hysteroscopy, D&C, polypectomy for abnormal uterine bleeding and polypectomy. Office sonohystogram in March showed a small polyp measuring .7 x.3 cm. Pt has been attempting pregnancy without success. R/B/A reviewed an the patient wishes to proceed   Patient's last menstrual period was 01/29/2019.    Past Medical History:  Diagnosis Date  . Allergy   . Asthma    esp in childhood  . Migraine     Past Surgical History:  Procedure Laterality Date  . WISDOM TOOTH EXTRACTION      History reviewed. No pertinent family history.  Social History:  reports that she has never smoked. She has never used smokeless tobacco. She reports that she does not drink alcohol or use drugs.  Allergies:  Allergies  Allergen Reactions  . Prednisone     Insomnia, jittery with prednisone but can tolerate flonase.     Medications Prior to Admission  Medication Sig Dispense Refill Last Dose  . ibuprofen (ADVIL,MOTRIN) 200 MG tablet Take 200 mg by mouth every 6 (six) hours as needed.   02/08/2019 at Unknown time  . Prenatal MV-Min-Fe Fum-FA-DHA (PRENATAL 1 PO) Take by mouth daily.   02/08/2019 at Unknown time  . cyclobenzaprine (FLEXERIL) 5 MG tablet Take 0.5-1 tablets (2.5-5 mg total) by mouth daily as needed (headache). 30 tablet 5 Unknown at Unknown time  . fluticasone (FLONASE) 50 MCG/ACT nasal spray Place 1-2 sprays into both nostrils daily.   Unknown at Unknown time  . SUMAtriptan (IMITREX) 100 MG tablet Take 1 tablet (100 mg total) by mouth daily as needed for migraine (limit use as much as possible). 10 tablet 1     ROS  Blood pressure 127/70, pulse 91, temperature 98.3 F (36.8 C), temperature source Oral, resp. rate 16, height 5\' 5"  (1.651 m), weight 93.6 kg, last menstrual period 01/29/2019, SpO2 100 %. Physical Exam  AOx3, NAD Abd soft Normal work of breathing  Results for orders placed or performed during the hospital  encounter of 02/09/19 (from the past 24 hour(s))  Pregnancy, urine POC     Status: None   Collection Time: 02/09/19 10:46 AM  Result Value Ref Range   Preg Test, Ur NEGATIVE NEGATIVE  Type and screen [facility]     Status: None (Preliminary result)   Collection Time: 02/09/19 10:57 AM  Result Value Ref Range   ABO/RH(D) PENDING    Antibody Screen PENDING    Sample Expiration      02/12/2019,2359 Performed at [facility], [place] [address] [place], [place] [address]     No results found.  Assessment/Plan: 1) Admit 2) scdS 3) Proceed with hysteroscopy D&C  Vanessa Kick 02/09/2019, 11:49 AM

## 2019-02-09 NOTE — Transfer of Care (Signed)
Immediate Anesthesia Transfer of Care Note  Patient: Margaret Cunningham  Procedure(s) Performed: DILATATION AND CURETTAGE /HYSTEROSCOPY (N/A ) CERVICAL POLYPECTOMY (N/A )  Patient Location: PACU  Anesthesia Type:General  Level of Consciousness: awake, alert  and oriented  Airway & Oxygen Therapy: Patient Spontanous Breathing and Patient connected to nasal cannula oxygen  Post-op Assessment: Report given to RN and Post -op Vital signs reviewed and stable  Post vital signs: Reviewed and stable  Last Vitals:  Vitals Value Taken Time  BP 119/84 02/09/19 1303  Temp    Pulse 82 02/09/19 1305  Resp 17 02/09/19 1305  SpO2 100 % 02/09/19 1305  Vitals shown include unvalidated device data.  Last Pain:  Vitals:   02/09/19 1040  TempSrc: Oral      Patients Stated Pain Goal: 4 (34/19/37 9024)  Complications: No apparent anesthesia complications

## 2019-02-10 ENCOUNTER — Encounter (HOSPITAL_BASED_OUTPATIENT_CLINIC_OR_DEPARTMENT_OTHER): Payer: Self-pay | Admitting: Obstetrics and Gynecology

## 2019-02-10 NOTE — Anesthesia Postprocedure Evaluation (Signed)
Anesthesia Post Note  Patient: Eyla Tallon Wismer  Procedure(s) Performed: DILATATION AND CURETTAGE /HYSTEROSCOPY (N/A ) CERVICAL POLYPECTOMY (N/A )     Patient location during evaluation: PACU Anesthesia Type: General Level of consciousness: awake Pain management: pain level controlled Vital Signs Assessment: post-procedure vital signs reviewed and stable Respiratory status: spontaneous breathing Cardiovascular status: stable Postop Assessment: no apparent nausea or vomiting Anesthetic complications: no    Last Vitals:  Vitals:   02/09/19 1345 02/09/19 1452  BP: 117/81 117/71  Pulse: 72 79  Resp: 14 16  Temp:  37.1 C  SpO2: 99% 98%    Last Pain:  Vitals:   02/10/19 1411  TempSrc:   PainSc: 0-No pain   Pain Goal: Patients Stated Pain Goal: 4 (02/09/19 1400)                 Huston Foley

## 2019-03-10 DIAGNOSIS — Z6836 Body mass index (BMI) 36.0-36.9, adult: Secondary | ICD-10-CM | POA: Diagnosis not present

## 2019-03-10 DIAGNOSIS — N840 Polyp of corpus uteri: Secondary | ICD-10-CM | POA: Diagnosis not present

## 2019-04-03 DIAGNOSIS — F4323 Adjustment disorder with mixed anxiety and depressed mood: Secondary | ICD-10-CM | POA: Diagnosis not present

## 2019-04-10 DIAGNOSIS — F4323 Adjustment disorder with mixed anxiety and depressed mood: Secondary | ICD-10-CM | POA: Diagnosis not present

## 2019-04-24 DIAGNOSIS — F411 Generalized anxiety disorder: Secondary | ICD-10-CM | POA: Diagnosis not present

## 2019-05-08 DIAGNOSIS — F411 Generalized anxiety disorder: Secondary | ICD-10-CM | POA: Diagnosis not present

## 2019-05-22 DIAGNOSIS — F411 Generalized anxiety disorder: Secondary | ICD-10-CM | POA: Diagnosis not present

## 2019-06-05 DIAGNOSIS — F411 Generalized anxiety disorder: Secondary | ICD-10-CM | POA: Diagnosis not present

## 2019-06-19 DIAGNOSIS — F411 Generalized anxiety disorder: Secondary | ICD-10-CM | POA: Diagnosis not present

## 2019-06-29 DIAGNOSIS — Z3141 Encounter for fertility testing: Secondary | ICD-10-CM | POA: Diagnosis not present

## 2019-06-29 DIAGNOSIS — E288 Other ovarian dysfunction: Secondary | ICD-10-CM | POA: Diagnosis not present

## 2019-06-29 DIAGNOSIS — Z319 Encounter for procreative management, unspecified: Secondary | ICD-10-CM | POA: Diagnosis not present

## 2019-07-03 DIAGNOSIS — F411 Generalized anxiety disorder: Secondary | ICD-10-CM | POA: Diagnosis not present

## 2019-07-19 DIAGNOSIS — Z3141 Encounter for fertility testing: Secondary | ICD-10-CM | POA: Diagnosis not present

## 2019-07-19 DIAGNOSIS — E288 Other ovarian dysfunction: Secondary | ICD-10-CM | POA: Diagnosis not present

## 2019-07-19 DIAGNOSIS — Z319 Encounter for procreative management, unspecified: Secondary | ICD-10-CM | POA: Diagnosis not present

## 2019-07-27 DIAGNOSIS — Z319 Encounter for procreative management, unspecified: Secondary | ICD-10-CM | POA: Diagnosis not present

## 2019-07-27 DIAGNOSIS — E288 Other ovarian dysfunction: Secondary | ICD-10-CM | POA: Diagnosis not present

## 2019-08-08 DIAGNOSIS — F411 Generalized anxiety disorder: Secondary | ICD-10-CM | POA: Diagnosis not present

## 2019-08-18 DIAGNOSIS — Z319 Encounter for procreative management, unspecified: Secondary | ICD-10-CM | POA: Diagnosis not present

## 2019-08-29 DIAGNOSIS — F411 Generalized anxiety disorder: Secondary | ICD-10-CM | POA: Diagnosis not present

## 2019-09-18 DIAGNOSIS — Z319 Encounter for procreative management, unspecified: Secondary | ICD-10-CM | POA: Diagnosis not present

## 2019-10-19 DIAGNOSIS — Z3189 Encounter for other procreative management: Secondary | ICD-10-CM | POA: Diagnosis not present

## 2019-11-21 DIAGNOSIS — Z3189 Encounter for other procreative management: Secondary | ICD-10-CM | POA: Diagnosis not present

## 2019-11-23 DIAGNOSIS — Z3189 Encounter for other procreative management: Secondary | ICD-10-CM | POA: Diagnosis not present

## 2019-12-13 DIAGNOSIS — Z319 Encounter for procreative management, unspecified: Secondary | ICD-10-CM | POA: Diagnosis not present

## 2019-12-13 DIAGNOSIS — E288 Other ovarian dysfunction: Secondary | ICD-10-CM | POA: Diagnosis not present

## 2020-04-08 DIAGNOSIS — Z3200 Encounter for pregnancy test, result unknown: Secondary | ICD-10-CM | POA: Diagnosis not present

## 2020-04-08 DIAGNOSIS — Z3141 Encounter for fertility testing: Secondary | ICD-10-CM | POA: Diagnosis not present

## 2020-04-16 DIAGNOSIS — Z3169 Encounter for other general counseling and advice on procreation: Secondary | ICD-10-CM | POA: Diagnosis not present

## 2020-04-26 DIAGNOSIS — Z3141 Encounter for fertility testing: Secondary | ICD-10-CM | POA: Diagnosis not present

## 2020-05-15 DIAGNOSIS — Z3169 Encounter for other general counseling and advice on procreation: Secondary | ICD-10-CM | POA: Diagnosis not present

## 2020-06-06 ENCOUNTER — Encounter: Payer: Self-pay | Admitting: Family Medicine

## 2020-06-09 NOTE — Progress Notes (Signed)
    Margaret Duling T. Danelly Hassinger, MD, CAQ Sports Medicine  Primary Care and Sports Medicine [facility] at [facility] [address] [place], [address]  Phone: [phone]  FAX: [phone]  Margaret Cunningham - 29 y.o. female  MRN [id number]  Date of Birth: 05/06/1991  Date: 06/10/2020  PCP: Margaret Nam, MD  Referral: Margaret Nam, MD  Chief Complaint  Patient presents with  . Urinary Urgency    This visit occurred during the SARS-CoV-2 public health emergency.  Safety protocols were in place, including screening questions prior to the visit, additional usage of staff PPE, and extensive cleaning of exam room while observing appropriate contact time as indicated for disinfecting solutions.   Subjective:   Margaret Cunningham is a 29 y.o. very pleasant female patient with Body mass index is 35.99 kg/m. who presents with the following:  She is here with some urinary urgency and dysuria.  All of her symptoms started on Friday. Sometimes she is going to the bathroom often with out much volume of urine.  Has been using AZO.    She denies any rashes, potential STD exposure.  Review of Systems is noted in the HPI, as appropriate  Objective:   BP 90/64   Pulse 79   Temp 98.5 F (36.9 C) (Temporal)   Ht 5\' 5"  (1.651 m)   Wt 216 lb 4 oz (98.1 kg)   LMP 05/14/2020   SpO2 95%   BMI 35.99 kg/m   GEN: No acute distress; alert,appropriate. PULM: Breathing comfortably in no respiratory distress PSYCH: Normally interactive.   ABD: S, NT, ND, + BS, No rebound, No HSM  No CVAT  Laboratory and Imaging Data:  Assessment and Plan:     ICD-10-CM   1. Dysuria  R30.0   2. Urgency of urination  R39.15 POCT Urinalysis Dipstick (Automated)   Treat as UTI, culture pending  Meds ordered this encounter  Medications  . nitrofurantoin, macrocrystal-monohydrate, (MACROBID) 100 MG capsule    Sig: Take 1 capsule (100 mg total) by mouth 2 (two) times daily  for 7 days.    Dispense:  14 capsule    Refill:  0   Medications Discontinued During This Encounter  Medication Reason  . fluticasone (FLONASE) 50 MCG/ACT nasal spray Completed Course  . HYDROcodone-acetaminophen (NORCO/VICODIN) 5-325 MG tablet Completed Course   Orders Placed This Encounter  Procedures  . POCT Urinalysis Dipstick (Automated)    Follow-up: No follow-ups on file.  Signed,  05/16/2020. Margaret Casados, MD   Outpatient Encounter Medications as of 06/10/2020  Medication Sig  . cyclobenzaprine (FLEXERIL) 5 MG tablet Take 0.5-1 tablets (2.5-5 mg total) by mouth daily as needed (headache).  06/12/2020 ibuprofen (ADVIL,MOTRIN) 200 MG tablet Take 200 mg by mouth every 6 (six) hours as needed.  . Prenatal MV-Min-Fe Fum-FA-DHA (PRENATAL 1 PO) Take by mouth daily.  . SUMAtriptan (IMITREX) 100 MG tablet Take 1 tablet (100 mg total) by mouth daily as needed for migraine (limit use as much as possible).  . nitrofurantoin, macrocrystal-monohydrate, (MACROBID) 100 MG capsule Take 1 capsule (100 mg total) by mouth 2 (two) times daily for 7 days.  . [DISCONTINUED] fluticasone (FLONASE) 50 MCG/ACT nasal spray Place 1-2 sprays into both nostrils daily.  . [DISCONTINUED] HYDROcodone-acetaminophen (NORCO/VICODIN) 5-325 MG tablet Take 1 tablet by mouth every 6 (six) hours as needed for moderate pain.   No facility-administered encounter medications on file as of 06/10/2020.

## 2020-06-10 ENCOUNTER — Ambulatory Visit: Payer: BC Managed Care – PPO | Admitting: Family Medicine

## 2020-06-10 ENCOUNTER — Encounter: Payer: Self-pay | Admitting: Family Medicine

## 2020-06-10 ENCOUNTER — Other Ambulatory Visit: Payer: Self-pay

## 2020-06-10 VITALS — BP 90/64 | HR 79 | Temp 98.5°F | Ht 65.0 in | Wt 216.2 lb

## 2020-06-10 DIAGNOSIS — R3915 Urgency of urination: Secondary | ICD-10-CM

## 2020-06-10 DIAGNOSIS — R300 Dysuria: Secondary | ICD-10-CM

## 2020-06-10 LAB — POC URINALSYSI DIPSTICK (AUTOMATED)
Bilirubin, UA: NEGATIVE
Blood, UA: NEGATIVE
Glucose, UA: NEGATIVE
Ketones, UA: NEGATIVE
Leukocytes, UA: NEGATIVE
Nitrite, UA: NEGATIVE
Protein, UA: NEGATIVE
Spec Grav, UA: 1.01 (ref 1.010–1.025)
Urobilinogen, UA: 0.2 E.U./dL
pH, UA: 6 (ref 5.0–8.0)

## 2020-06-10 MED ORDER — NITROFURANTOIN MONOHYD MACRO 100 MG PO CAPS: 100.0000 mg | ORAL_CAPSULE | Freq: Two times a day (BID) | ORAL | 0 refills | Status: AC

## 2020-06-10 NOTE — Addendum Note (Signed)
Addended by: Damita Lack on: 06/10/2020 10:01 AM   Modules accepted: Orders

## 2020-06-11 LAB — URINE CULTURE
MICRO NUMBER:: 11233106
SPECIMEN QUALITY:: ADEQUATE

## 2020-06-20 DIAGNOSIS — Z01818 Encounter for other preprocedural examination: Secondary | ICD-10-CM | POA: Diagnosis not present

## 2020-06-20 DIAGNOSIS — N946 Dysmenorrhea, unspecified: Secondary | ICD-10-CM | POA: Diagnosis not present

## 2020-06-20 DIAGNOSIS — R102 Pelvic and perineal pain: Secondary | ICD-10-CM | POA: Diagnosis not present

## 2020-07-05 DIAGNOSIS — N946 Dysmenorrhea, unspecified: Secondary | ICD-10-CM | POA: Diagnosis not present

## 2020-07-05 DIAGNOSIS — Z01812 Encounter for preprocedural laboratory examination: Secondary | ICD-10-CM | POA: Diagnosis not present

## 2020-07-05 DIAGNOSIS — R102 Pelvic and perineal pain: Secondary | ICD-10-CM | POA: Diagnosis not present

## 2020-07-05 DIAGNOSIS — Z20822 Contact with and (suspected) exposure to covid-19: Secondary | ICD-10-CM | POA: Diagnosis not present

## 2020-07-08 DIAGNOSIS — N803 Endometriosis of pelvic peritoneum: Secondary | ICD-10-CM | POA: Diagnosis not present

## 2020-07-08 DIAGNOSIS — R102 Pelvic and perineal pain: Secondary | ICD-10-CM | POA: Diagnosis not present

## 2020-07-08 DIAGNOSIS — G8929 Other chronic pain: Secondary | ICD-10-CM | POA: Diagnosis not present

## 2020-07-08 DIAGNOSIS — N838 Other noninflammatory disorders of ovary, fallopian tube and broad ligament: Secondary | ICD-10-CM | POA: Diagnosis not present

## 2020-07-08 DIAGNOSIS — N809 Endometriosis, unspecified: Secondary | ICD-10-CM | POA: Diagnosis not present

## 2020-10-07 DIAGNOSIS — Z3141 Encounter for fertility testing: Secondary | ICD-10-CM | POA: Diagnosis not present

## 2020-10-11 DIAGNOSIS — Z3141 Encounter for fertility testing: Secondary | ICD-10-CM | POA: Diagnosis not present

## 2021-01-13 DIAGNOSIS — Z3141 Encounter for fertility testing: Secondary | ICD-10-CM | POA: Diagnosis not present

## 2021-01-15 DIAGNOSIS — Z3189 Encounter for other procreative management: Secondary | ICD-10-CM | POA: Diagnosis not present

## 2021-02-11 DIAGNOSIS — Z3141 Encounter for fertility testing: Secondary | ICD-10-CM | POA: Diagnosis not present

## 2021-02-18 DIAGNOSIS — Z3169 Encounter for other general counseling and advice on procreation: Secondary | ICD-10-CM | POA: Diagnosis not present

## 2021-03-10 DIAGNOSIS — Z3141 Encounter for fertility testing: Secondary | ICD-10-CM | POA: Diagnosis not present

## 2021-03-17 DIAGNOSIS — Z3141 Encounter for fertility testing: Secondary | ICD-10-CM | POA: Diagnosis not present

## 2021-03-20 DIAGNOSIS — Z3189 Encounter for other procreative management: Secondary | ICD-10-CM | POA: Diagnosis not present

## 2021-03-20 DIAGNOSIS — N978 Female infertility of other origin: Secondary | ICD-10-CM | POA: Diagnosis not present

## 2021-08-04 ENCOUNTER — Ambulatory Visit: Payer: BC Managed Care – PPO | Admitting: Family Medicine

## 2021-08-04 ENCOUNTER — Encounter: Payer: Self-pay | Admitting: Family Medicine

## 2021-08-04 ENCOUNTER — Other Ambulatory Visit: Payer: Self-pay

## 2021-08-04 VITALS — BP 110/80 | HR 81 | Temp 98.6°F | Ht 65.25 in | Wt 219.5 lb

## 2021-08-04 DIAGNOSIS — R1011 Right upper quadrant pain: Secondary | ICD-10-CM

## 2021-08-04 DIAGNOSIS — R1013 Epigastric pain: Secondary | ICD-10-CM

## 2021-08-04 DIAGNOSIS — R1084 Generalized abdominal pain: Secondary | ICD-10-CM | POA: Diagnosis not present

## 2021-08-04 LAB — POCT URINE PREGNANCY: Preg Test, Ur: NEGATIVE

## 2021-08-04 NOTE — Progress Notes (Addendum)
Margaret Gacek T. Zaryiah Barz, MD, [place] at [facility] [place], [address]  Phone: [phone]   FAX: [place] - 31 y.o. female   MRN [id number]   Date of Birth: 23-Mar-1991  Date: 08/04/2021   PCP: [name], MD   Referral: [name], MD  Chief Complaint  Patient presents with   Abdominal Pain    EpiGastric Pain     This visit occurred during the SARS-CoV-2 public health emergency.  Safety protocols were in place, including screening questions prior to the visit, additional usage of staff PPE, and extensive cleaning of exam room while observing appropriate contact time as indicated for disinfecting solutions.   Subjective:   Margaret Cunningham is a 31 y.o. very pleasant female patient with Body mass index is 36.25 kg/m. who presents with the following:  RUQ last Sunday - 8 days ago.  Going out of town next Friday.  Her pain started without any kind of trauma or incident.  It has been waxing and waning, and predominantly in the epigastric region, but to a lesser extent in the right upper quadrant.  She has been able to eat and drink, at least at her baseline.  She generally eats only 2 meals away, and she fasts generally all the time, has a small lunch.  She also does have some loose stool and diarrhea, but this is present at baseline.  Last menstrual period was July 16, 2021.  She is not having any fever, chills, congestion, no foods in particular bothering her. She has not any black tarry stools no bright red blood per rectum.  She does have a history of endometriosis, but she had recently been following with reproductive endocrinology, and they have been having serial ultrasounds which had been normal as soon as 2 months ago.  Ongoing GERD.  This has been flared up quite a bit recently.  She does currently take Pepcid AC.   Review of Systems is noted in the HPI, as  appropriate  Patient Active Problem List   Diagnosis Date Noted   Advance care planning 01/14/2017   Cough 04/06/2013   Migraine without aura 01/25/2013    Past Medical History:  Diagnosis Date   Allergy    Asthma    esp in childhood   Migraine     Past Surgical History:  Procedure Laterality Date   CERVICAL POLYPECTOMY N/A 02/09/2019   Procedure: CERVICAL POLYPECTOMY;  Surgeon: Vanessa Kick, MD;  Location[facility];  Service: Gynecology;  Laterality: N/A;   HYSTEROSCOPY WITH D & C N/A 02/09/2019   Procedure: DILATATION AND CURETTAGE /HYSTEROSCOPY;  Surgeon: Vanessa Kick, MD;  Location: [facility];  Service: Gynecology;  Laterality: N/A;   WISDOM TOOTH EXTRACTION      History reviewed. No pertinent family history.   Objective:   BP 110/80    Pulse 81    Temp 98.6 F (37 C) (Temporal)    Ht 5' 5.25" (1.657 m)    Wt 219 lb 8 oz (99.6 kg)    LMP 07/16/2021    SpO2 98%    BMI 36.25 kg/m   GEN: No acute distress; alert,appropriate. PULM: Breathing comfortably in no respiratory distress PSYCH: Normally interactive.  ABD: S, does have some tenderness in the epigastric region, to a lesser extent in the right upper quadrant, and a mild amount in the left lower quadrant., ND, + BS, No  rebound, No HSM   Laboratory and Imaging Data: Results for orders placed or performed in visit on [id number]  Basic metabolic panel  Result Value Ref Range   Sodium 139 135 - 145 mEq/L   Potassium 3.9 3.5 - 5.1 mEq/L   Chloride 103 96 - 112 mEq/L   CO2 26 19 - 32 mEq/L   Glucose, Bld 86 70 - 99 mg/dL   BUN 5 (L) 6 - 23 mg/dL   Creatinine, Ser 0.71 0.40 - 1.20 mg/dL   GFR 113.92 >60.00 mL/min   Calcium 9.6 8.4 - 10.5 mg/dL  CBC with Differential/Platelet  Result Value Ref Range   WBC 7.7 4.0 - 10.5 K/uL   RBC 4.82 3.87 - 5.11 Mil/uL   Hemoglobin 13.8 12.0 - 15.0 g/dL   HCT 41.9 36.0 - 46.0 %   MCV 87.0 78.0 - 100.0 fl   MCHC 32.9 30.0 - 36.0 g/dL   RDW 13.5  11.5 - 15.5 %   Platelets 259.0 150.0 - 400.0 K/uL   Neutrophils Relative % 64.5 43.0 - 77.0 %   Lymphocytes Relative 25.7 12.0 - 46.0 %   Monocytes Relative 6.9 3.0 - 12.0 %   Eosinophils Relative 1.7 0.0 - 5.0 %   Basophils Relative 1.2 0.0 - 3.0 %   Neutro Abs 5.0 1.4 - 7.7 K/uL   Lymphs Abs 2.0 0.7 - 4.0 K/uL   Monocytes Absolute 0.5 0.1 - 1.0 K/uL   Eosinophils Absolute 0.1 0.0 - 0.7 K/uL   Basophils Absolute 0.1 0.0 - 0.1 K/uL  Hepatic function panel  Result Value Ref Range   Total Bilirubin 0.4 0.2 - 1.2 mg/dL   Bilirubin, Direct 0.0 0.0 - 0.3 mg/dL   Alkaline Phosphatase 22 (L) 39 - 117 U/L   AST 19 0 - 37 U/L   ALT 13 0 - 35 U/L   Total Protein 7.7 6.0 - 8.3 g/dL   Albumin 4.5 3.5 - 5.2 g/dL  Lipase  Result Value Ref Range   Lipase 24.0 11.0 - 59.0 U/L  H. pylori antibody, IgG  Result Value Ref Range   H Pylori IgG Negative Negative  POCT urine pregnancy  Result Value Ref Range   Preg Test, Ur Negative Negative    Results for orders placed or performed in visit on [id number]  Basic metabolic panel  Result Value Ref Range   Sodium 139 135 - 145 mEq/L   Potassium 3.9 3.5 - 5.1 mEq/L   Chloride 103 96 - 112 mEq/L   CO2 26 19 - 32 mEq/L   Glucose, Bld 86 70 - 99 mg/dL   BUN 5 (L) 6 - 23 mg/dL   Creatinine, Ser 0.71 0.40 - 1.20 mg/dL   GFR 113.92 >60.00 mL/min   Calcium 9.6 8.4 - 10.5 mg/dL  CBC with Differential/Platelet  Result Value Ref Range   WBC 7.7 4.0 - 10.5 K/uL   RBC 4.82 3.87 - 5.11 Mil/uL   Hemoglobin 13.8 12.0 - 15.0 g/dL   HCT 41.9 36.0 - 46.0 %   MCV 87.0 78.0 - 100.0 fl   MCHC 32.9 30.0 - 36.0 g/dL   RDW 13.5 11.5 - 15.5 %   Platelets 259.0 150.0 - 400.0 K/uL   Neutrophils Relative % 64.5 43.0 - 77.0 %   Lymphocytes Relative 25.7 12.0 - 46.0 %   Monocytes Relative 6.9 3.0 - 12.0 %   Eosinophils Relative 1.7 0.0 - 5.0 %   Basophils Relative 1.2 0.0 - 3.0 %  Neutro Abs 5.0 1.4 - 7.7 K/uL   Lymphs Abs 2.0 0.7 - 4.0 K/uL   Monocytes Absolute  0.5 0.1 - 1.0 K/uL   Eosinophils Absolute 0.1 0.0 - 0.7 K/uL   Basophils Absolute 0.1 0.0 - 0.1 K/uL  Hepatic function panel  Result Value Ref Range   Total Bilirubin 0.4 0.2 - 1.2 mg/dL   Bilirubin, Direct 0.0 0.0 - 0.3 mg/dL   Alkaline Phosphatase 22 (L) 39 - 117 U/L   AST 19 0 - 37 U/L   ALT 13 0 - 35 U/L   Total Protein 7.7 6.0 - 8.3 g/dL   Albumin 4.5 3.5 - 5.2 g/dL  Lipase  Result Value Ref Range   Lipase 24.0 11.0 - 59.0 U/L  H. pylori antibody, IgG  Result Value Ref Range   H Pylori IgG Negative Negative  POCT urine pregnancy  Result Value Ref Range   Preg Test, Ur Negative Negative     Assessment and Plan:     ICD-10-CM   1. Epigastric abdominal pain  R10.13 POCT urine pregnancy    Basic metabolic panel    CBC with Differential/Platelet    Hepatic function panel    Lipase    H. pylori antibody, IgG    US ABDOMEN LIMITED RUQ (LIVER/GB)    2. Generalized abdominal pain  R10.84 POCT urine pregnancy    Basic metabolic panel    CBC with Differential/Platelet    Hepatic function panel    Lipase    H. pylori antibody, IgG    US ABDOMEN LIMITED RUQ (LIVER/GB)    3. Right upper quadrant abdominal pain  R10.11 US ABDOMEN LIMITED RUQ (LIVER/GB)     Abdominal pain of unclear source.  Epigastric greater than right upper quadrant.  I am going to first do a broad laboratory work-up.  If a more likely cause is elicited based on lab work then we should treat as such.  In a 31 year old female with BMI of 36, and right upper quadrant pain, think it is most appropriate to get an ultrasound of the right upper quadrant to evaluate her gallbladder and liver unless there is a very clear cause for her symptoms.  If there is a clear cause then this may not be needed in that initial timeframe.  UPT is negative.  She understands plan of care.  While she was doing some reproductive endocrinology, she has not been doing this in the last few months.  Addendum: 08/06/21 8:11 AM  At  this point, all of her labs have returned, and they are unremarkable.  With epigastric and right upper quadrant pain without a source, I will order a right upper quadrant ultrasound to evaluate for gallstones and to evaluate gallbladder and liver.   I will also suggest a PPI BID for now.  Orders Placed This Encounter  Procedures   US ABDOMEN LIMITED RUQ (LIVER/GB)   Basic metabolic panel   CBC with Differential/Platelet   Hepatic function panel   Lipase   H. pylori antibody, IgG   POCT urine pregnancy    Follow-up: No follow-ups on file.  [employer] speech-to-text software was used for transcription in this dictation.  Possible transcriptional errors can occur using Editor, commissioning.   Signed,  Maud Deed. Kemiyah Tarazon, MD   Outpatient Encounter Medications as of 08/04/2021  Medication Sig   cyclobenzaprine (FLEXERIL) 5 MG tablet Take 0.5-1 tablets (2.5-5 mg total) by mouth daily as needed (headache).   famotidine (PEPCID AC MAXIMUM STRENGTH)  20 MG tablet Take 20 mg by mouth daily.   ibuprofen (ADVIL,MOTRIN) 200 MG tablet Take 200 mg by mouth every 6 (six) hours as needed.   SUMAtriptan (IMITREX) 100 MG tablet Take 1 tablet (100 mg total) by mouth daily as needed for migraine (limit use as much as possible).   [DISCONTINUED] Prenatal MV-Min-Fe Fum-FA-DHA (PRENATAL 1 PO) Take by mouth daily.   No facility-administered encounter medications on file as of 08/04/2021.

## 2021-08-05 LAB — HEPATIC FUNCTION PANEL
ALT: 13 U/L (ref 0–35)
AST: 19 U/L (ref 0–37)
Albumin: 4.5 g/dL (ref 3.5–5.2)
Alkaline Phosphatase: 22 U/L — ABNORMAL LOW (ref 39–117)
Bilirubin, Direct: 0 mg/dL (ref 0.0–0.3)
Total Bilirubin: 0.4 mg/dL (ref 0.2–1.2)
Total Protein: 7.7 g/dL (ref 6.0–8.3)

## 2021-08-05 LAB — CBC WITH DIFFERENTIAL/PLATELET
Basophils Absolute: 0.1 10*3/uL (ref 0.0–0.1)
Basophils Relative: 1.2 % (ref 0.0–3.0)
Eosinophils Absolute: 0.1 10*3/uL (ref 0.0–0.7)
Eosinophils Relative: 1.7 % (ref 0.0–5.0)
HCT: 41.9 % (ref 36.0–46.0)
Hemoglobin: 13.8 g/dL (ref 12.0–15.0)
Lymphocytes Relative: 25.7 % (ref 12.0–46.0)
Lymphs Abs: 2 10*3/uL (ref 0.7–4.0)
MCHC: 32.9 g/dL (ref 30.0–36.0)
MCV: 87 fl (ref 78.0–100.0)
Monocytes Absolute: 0.5 10*3/uL (ref 0.1–1.0)
Monocytes Relative: 6.9 % (ref 3.0–12.0)
Neutro Abs: 5 10*3/uL (ref 1.4–7.7)
Neutrophils Relative %: 64.5 % (ref 43.0–77.0)
Platelets: 259 10*3/uL (ref 150.0–400.0)
RBC: 4.82 Mil/uL (ref 3.87–5.11)
RDW: 13.5 % (ref 11.5–15.5)
WBC: 7.7 10*3/uL (ref 4.0–10.5)

## 2021-08-05 LAB — BASIC METABOLIC PANEL
BUN: 5 mg/dL — ABNORMAL LOW (ref 6–23)
CO2: 26 mEq/L (ref 19–32)
Calcium: 9.6 mg/dL (ref 8.4–10.5)
Chloride: 103 mEq/L (ref 96–112)
Creatinine, Ser: 0.71 mg/dL (ref 0.40–1.20)
GFR: 113.92 mL/min (ref >60.00)
Glucose, Bld: 86 mg/dL (ref 70–99)
Potassium: 3.9 mEq/L (ref 3.5–5.1)
Sodium: 139 mEq/L (ref 135–145)

## 2021-08-05 LAB — LIPASE: Lipase: 24 U/L (ref 11.0–59.0)

## 2021-08-05 LAB — H. PYLORI ANTIBODY, IGG: H Pylori IgG: NEGATIVE

## 2021-08-06 NOTE — Addendum Note (Signed)
Addended by: Hannah Beat on: 08/06/2021 08:11 AM   Modules accepted: Orders

## 2021-09-02 ENCOUNTER — Ambulatory Visit
Admission: RE | Admit: 2021-09-02 | Discharge: 2021-09-02 | Disposition: A | Discharge status: Home / Self Care | Payer: BC Managed Care – PPO | Source: Ambulatory Visit | Attending: Family Medicine | Admitting: Family Medicine

## 2021-09-02 DIAGNOSIS — R1013 Epigastric pain: Secondary | ICD-10-CM

## 2021-09-02 DIAGNOSIS — R1084 Generalized abdominal pain: Secondary | ICD-10-CM

## 2021-09-02 DIAGNOSIS — R1011 Right upper quadrant pain: Secondary | ICD-10-CM

## 2022-02-23 DIAGNOSIS — O3680X1 Pregnancy with inconclusive fetal viability, fetus 1: Secondary | ICD-10-CM | POA: Diagnosis not present

## 2022-02-23 DIAGNOSIS — Z3A08 8 weeks gestation of pregnancy: Secondary | ICD-10-CM | POA: Diagnosis not present

## 2022-02-23 DIAGNOSIS — Z3201 Encounter for pregnancy test, result positive: Secondary | ICD-10-CM | POA: Diagnosis not present

## 2022-03-09 LAB — OB RESULTS CONSOLE HEPATITIS B SURFACE ANTIGEN
Hepatitis B Surface Ag: NEGATIVE
Hepatitis B Surface Ag: NEGATIVE

## 2022-03-09 LAB — OB RESULTS CONSOLE VARICELLA ZOSTER ANTIBODY, IGG
Varicella: IMMUNE
Varicella: IMMUNE

## 2022-03-09 LAB — OB RESULTS CONSOLE RUBELLA ANTIBODY, IGM
Rubella: IMMUNE
Rubella: IMMUNE

## 2022-03-15 ENCOUNTER — Inpatient Hospital Stay (HOSPITAL_COMMUNITY)
Admission: AD | Admit: 2022-03-15 | Discharge: 2022-03-15 | Disposition: A | Discharge status: Home / Self Care | Payer: No Typology Code available for payment source | Attending: Obstetrics and Gynecology | Admitting: Obstetrics and Gynecology

## 2022-03-15 ENCOUNTER — Encounter (HOSPITAL_COMMUNITY): Payer: Self-pay

## 2022-03-15 DIAGNOSIS — Z3A10 10 weeks gestation of pregnancy: Secondary | ICD-10-CM | POA: Insufficient documentation

## 2022-03-15 DIAGNOSIS — O4691 Antepartum hemorrhage, unspecified, first trimester: Secondary | ICD-10-CM | POA: Insufficient documentation

## 2022-03-15 DIAGNOSIS — O209 Hemorrhage in early pregnancy, unspecified: Secondary | ICD-10-CM

## 2022-03-15 DIAGNOSIS — O368390 Maternal care for abnormalities of the fetal heart rate or rhythm, unspecified trimester, not applicable or unspecified: Secondary | ICD-10-CM

## 2022-03-15 DIAGNOSIS — O4693 Antepartum hemorrhage, unspecified, third trimester: Secondary | ICD-10-CM | POA: Diagnosis present

## 2022-03-15 LAB — URINALYSIS, ROUTINE W REFLEX MICROSCOPIC
Bilirubin Urine: NEGATIVE
Glucose, UA: NEGATIVE mg/dL
Ketones, ur: NEGATIVE mg/dL
Leukocytes,Ua: NEGATIVE
Nitrite: NEGATIVE
Protein, ur: NEGATIVE mg/dL
Specific Gravity, Urine: 1.003 — ABNORMAL LOW (ref 1.005–1.030)
pH: 6 (ref 5.0–8.0)

## 2022-03-15 LAB — WET PREP, GENITAL
Clue Cells Wet Prep HPF POC: NONE SEEN
Sperm: NONE SEEN
Trich, Wet Prep: NONE SEEN
WBC, Wet Prep HPF POC: <10 (ref <10)
Yeast Wet Prep HPF POC: NONE SEEN

## 2022-03-15 LAB — OB RESULTS CONSOLE GC/CHLAMYDIA
Chlamydia: NEGATIVE
Neisseria Gonorrhea: NEGATIVE

## 2022-03-15 NOTE — MAU Provider Note (Addendum)
History     CSN: [id number]  Arrival date and time: 03/15/22 5681   Event Date/Time   First Provider Initiated Contact with Patient 03/15/22 0114      Chief Complaint  Patient presents with   Vaginal Bleeding   Margaret Cunningham is a 31 y.o. G1P0 at [redacted]w[redacted]d who receives care at [facility].  She presents today for Vaginal Bleeding.  She states she had some "pinkish red" spotting with wiping around 1130pm.  She denies clots and reports some "pinching" in her right hip that was present prior to the spotting.  She denies vaginal discharge or recent sexual activity.  No blood noted upon arrival to MAU.     OB History     Gravida  1   Para      Term      Preterm      AB      Living         SAB      IAB      Ectopic      Multiple      Live Births              Past Medical History:  Diagnosis Date   Allergy    Asthma    esp in childhood   Migraine     Past Surgical History:  Procedure Laterality Date   CERVICAL POLYPECTOMY N/A 02/09/2019   Procedure: CERVICAL POLYPECTOMY;  Surgeon: Waynard Reeds, MD;  Location: [facility];  Service: Gynecology;  Laterality: N/A;   HYSTEROSCOPY WITH D & C N/A 02/09/2019   Procedure: DILATATION AND CURETTAGE /HYSTEROSCOPY;  Surgeon: Waynard Reeds, MD;  Location: [facility];  Service: Gynecology;  Laterality: N/A;   WISDOM TOOTH EXTRACTION      History reviewed. No pertinent family history.  Social History   Tobacco Use   Smoking status: Never   Smokeless tobacco: Never  Substance Use Topics   Alcohol use: No    Alcohol/week: 0.0 standard drinks of alcohol   Drug use: No    Allergies:  Allergies  Allergen Reactions   Prednisone     Insomnia, jittery with prednisone but can tolerate flonase.     Medications Prior to Admission  Medication Sig Dispense Refill Last Dose   acetaminophen (TYLENOL) 500 MG tablet Take 500 mg by mouth every 6 (six) hours as needed for fever.    03/15/2022   Prenatal Vit-Fe Fumarate-FA (MULTIVITAMIN-PRENATAL) 27-0.8 MG TABS tablet Take 1 tablet by mouth daily at 12 noon.   03/15/2022   cyclobenzaprine (FLEXERIL) 5 MG tablet Take 0.5-1 tablets (2.5-5 mg total) by mouth daily as needed (headache). 30 tablet 5    famotidine (PEPCID AC MAXIMUM STRENGTH) 20 MG tablet Take 20 mg by mouth daily.      ibuprofen (ADVIL,MOTRIN) 200 MG tablet Take 200 mg by mouth every 6 (six) hours as needed.      SUMAtriptan (IMITREX) 100 MG tablet Take 1 tablet (100 mg total) by mouth daily as needed for migraine (limit use as much as possible). 10 tablet 1     Review of Systems  Gastrointestinal:  Positive for nausea. Negative for abdominal pain and vomiting.  Genitourinary:  Positive for vaginal bleeding (Spotting). Negative for difficulty urinating, dysuria and vaginal discharge.  Neurological:  Positive for headaches (Middle of Head; Pressure 2/10). Negative for dizziness and light-headedness.  Psychiatric/Behavioral:  The patient is nervous/anxious.    Physical Exam   Blood pressure 128/77, pulse Marland Kitchen)  105, temperature 98.4 F (36.9 C), temperature source Oral, resp. rate 16, height 5\' 5"  (1.651 m), weight 99.5 kg, last menstrual period 07/16/2021, SpO2 100 %.  Physical Exam Vitals reviewed. Exam conducted with a chaperone present.  Constitutional:      Appearance: Normal appearance.  HENT:     Head: Normocephalic and atraumatic.  Eyes:     Conjunctiva/sclera: Conjunctivae normal.  Cardiovascular:     Rate and Rhythm: Normal rate.  Pulmonary:     Effort: Pulmonary effort is normal. No respiratory distress.  Abdominal:     General: Bowel sounds are normal.  Genitourinary:    Comments: Cultures collected by nurse.  Brownish red blood noted on swabs.  No apparent blood on perineum.  Musculoskeletal:     Cervical back: Normal range of motion.  Skin:    General: Skin is warm and dry.  Neurological:     Mental Status: She is alert.  Psychiatric:         Mood and Affect: Mood is anxious.        Behavior: Behavior normal.    Patient informed that the ultrasound is considered a limited OB ultrasound and is not intended to be a complete ultrasound exam.  Patient also informed that the ultrasound is not being completed with the intent of assessing for fetal or placental anomalies or any pelvic abnormalities.  Explained that the purpose of today's ultrasound is to assess for  viability.  Patient acknowledges the purpose of the exam and the limitations of the study.     MAU Course  Procedures Results for orders placed or performed during the hospital encounter of 03/15/22 (from the past 24 hour(s))  Urinalysis, Routine w reflex microscopic Urine, Clean Catch     Status: Abnormal   Collection Time: 03/15/22  1:11 AM  Result Value Ref Range   Color, Urine YELLOW YELLOW   APPearance TURBID (A) CLEAR   Specific Gravity, Urine 1.003 (L) 1.005 - 1.030   pH 6.0 5.0 - 8.0   Glucose, UA NEGATIVE NEGATIVE mg/dL   Hgb urine dipstick MODERATE (A) NEGATIVE   Bilirubin Urine NEGATIVE NEGATIVE   Ketones, ur NEGATIVE NEGATIVE mg/dL   Protein, ur NEGATIVE NEGATIVE mg/dL   Nitrite NEGATIVE NEGATIVE   Leukocytes,Ua NEGATIVE NEGATIVE   RBC / HPF 0-5 0 - 5 RBC/hpf   WBC, UA 0-5 0 - 5 WBC/hpf   Bacteria, UA RARE (A) NONE SEEN   Squamous Epithelial / LPF 0-5 0 - 5   Mucus PRESENT     MDM Labs: UA, Wet Prep, GC/CT BSUS Assessment and Plan  31 year old, G1P0  SIUP at 10.1 weeks Vaginal Bleeding  -Reviewed POC with patient. -BSUS performed with patient express relief. Patient and SO tearful stating they have not heard the heartbeat yet!  -SIUP noted with FHR of 188 and CRL c/w dates at 10.1 weeks. -Cultures collected blindly. Blood noted, but brown in color.  -Bleeding precautions and possible causes of 1st trimester bleeding reviewed.  -Encouraged to report to MAU for any urgent or emergent pregnancy related concerns. -Discussed sending  results via mychart and patient agreeable. -Follow up as scheduled. -Encouraged to call primary office or return to MAU if symptoms worsen or with the onset of new symptoms. -Discharged to home in stable condition.   38 MSN, CNM Advanced Practice Provider, Center for [facility] 03/15/2022, 1:14 AM

## 2022-03-15 NOTE — MAU Note (Signed)
..  Margaret Cunningham is a 31 y.o. at Unknown here in MAU reporting: This morning you had a fever and cold-like symptoms, took tylenol and it went away. Around 11:30pm she had some pink spotting when she wiped. Reports rt sided cramping.  EDD per pt: 10/10/2022  Pain score: 1/10 Vitals:   03/15/22 0040  BP: 128/77  Pulse: (!) 105  Resp: 16  Temp: 98.4 F (36.9 C)  SpO2: 100%     NIO:EVOJJK to doppler in triage Lab orders placed from triage: UA

## 2022-03-16 LAB — GC/CHLAMYDIA PROBE AMP (~~LOC~~) NOT AT ARMC
Chlamydia: NEGATIVE
Comment: NEGATIVE
Comment: NORMAL
Neisseria Gonorrhea: NEGATIVE

## 2022-07-16 LAB — OB RESULTS CONSOLE RPR
RPR: NONREACTIVE
RPR: NONREACTIVE

## 2022-07-20 NOTE — L&D Delivery Note (Signed)
Delivery Note  Pt labored well to complete. She pushed for about 1.5hrs with slow but steady descent; several positions tried.  At 6:50 AM a healthy female was delivered by Dr. Terri Piedra via Vaginal, Spontaneous (Presentation: Middle Occiput Anterior).  APGAR: 9, 9; weight 9 lb (4082 g).   Anterior and posterior shoulders delivered with no difficulty with next two pushes. Body easily followed. Cord was clamped and cut after a minute delay with vigorous cry elicited on delivery.  Cord blood was obtained.  Placenta status: Spontaneous, Intact. Delena Bali.  Cord: 3 vessels with the following complications: None.    Following delivery of the placenta the LUS remained atonic despite uterine massage. Pitocin was run at 922ml/hr and TXA plus hemabate ( ans lomotil)  were administered while awaiting the [place].  The Corey Skains was placed when available and suction applied and the bleeding immediately improved.     Anesthesia: Epidural Episiotomy: None Lacerations: 2nd degree;Perineal Suture Repair: 2.0 vicryl 4-0 vicryl rapide Est. Blood Loss (mL): 1473mL  Mom to postpartum after jada observation and removal in one hour.   Baby to Couplet care / Skin to Skin.  Discussion re circumcision done. Pt consents to procedure    Logan Bores 10/09/2022, 10:05 AM

## 2022-08-07 IMAGING — US US ABDOMEN LIMITED
1 series · 14 of 25 positions shown · non-contrast
Comparison: None.

CLINICAL DATA: Upper abdominal pain, pain right upper quadrant

EXAM:
ULTRASOUND ABDOMEN LIMITED RIGHT UPPER QUADRANT

[Series 1: us abdomen limited · 0.20mm/px · 14 of 64 slices shown]
[im 1/64]
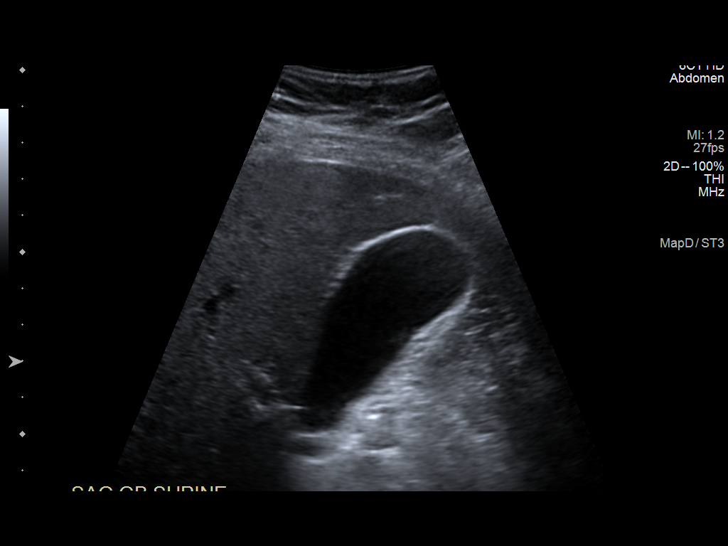
[im 6/64]
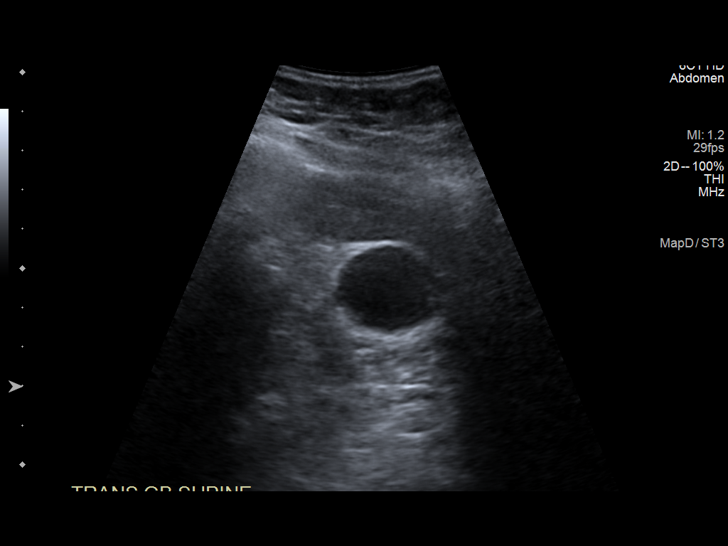
[im 11/64]
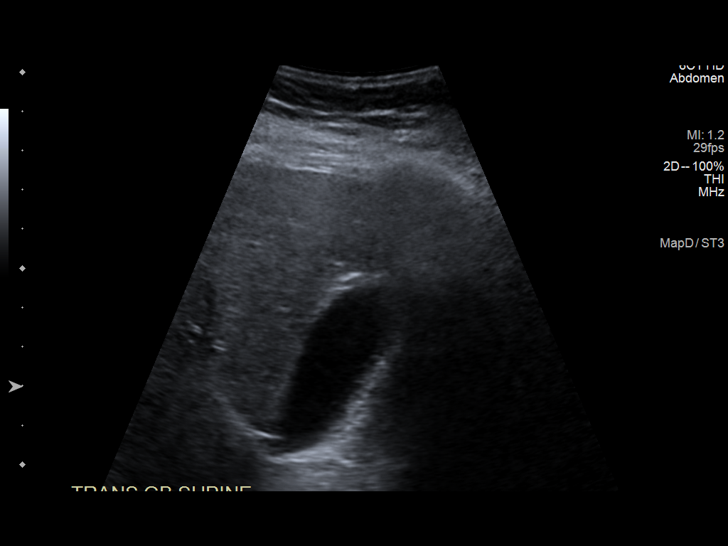
[im 16/64]
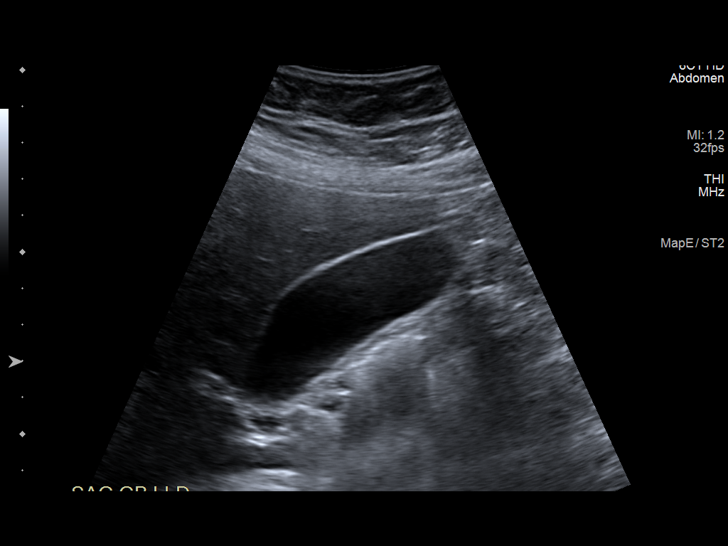
[im 22/64]
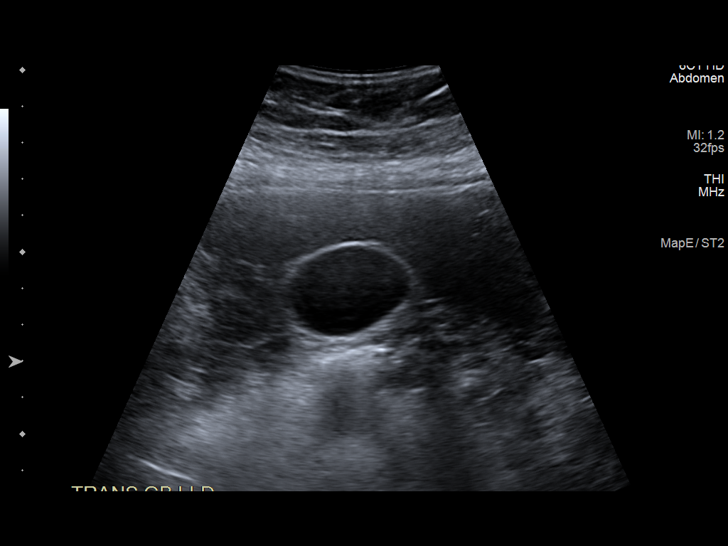
[im 24/64]
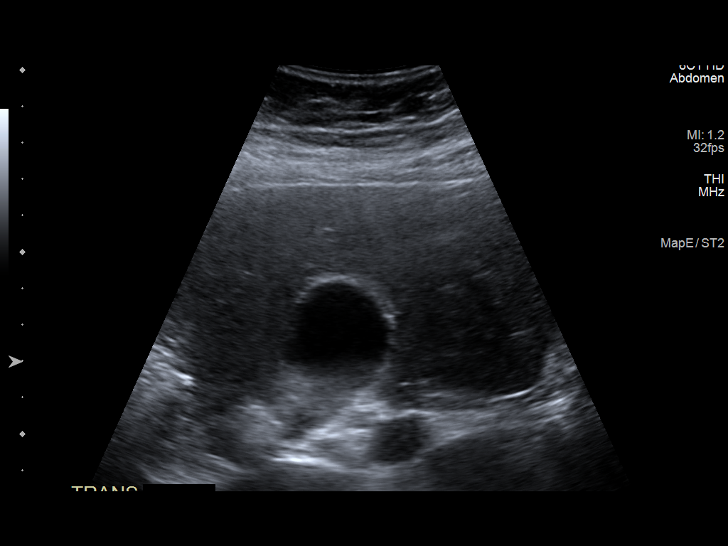
[im 29/64]
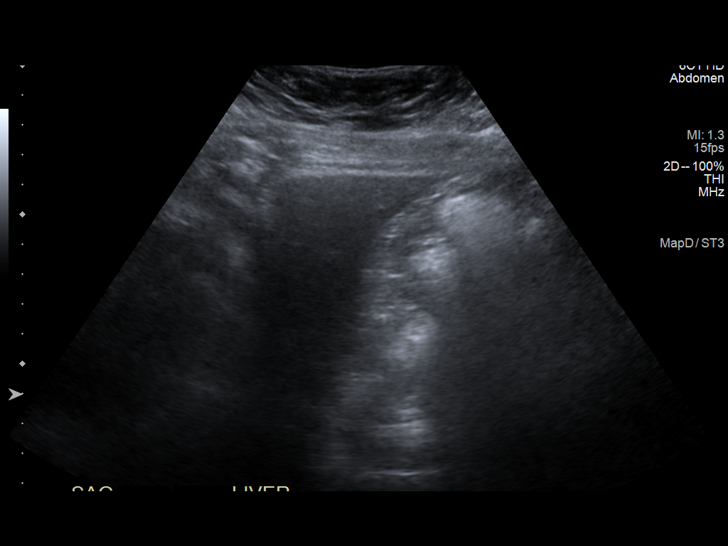
[im 35/64]
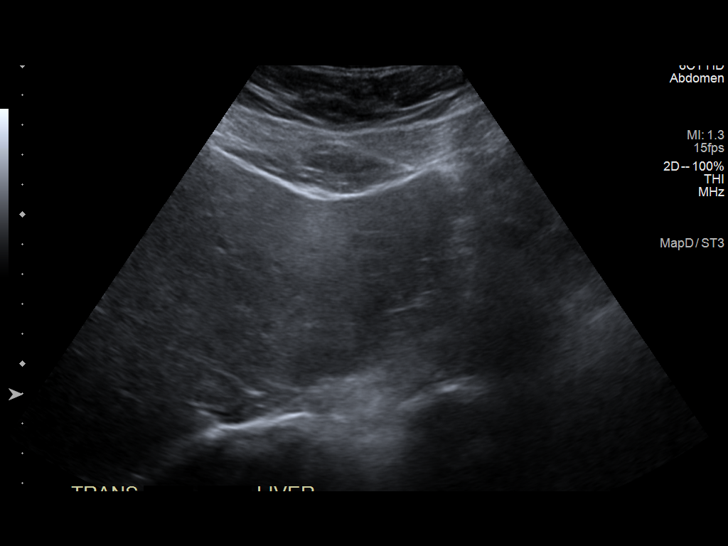
[im 40/64]
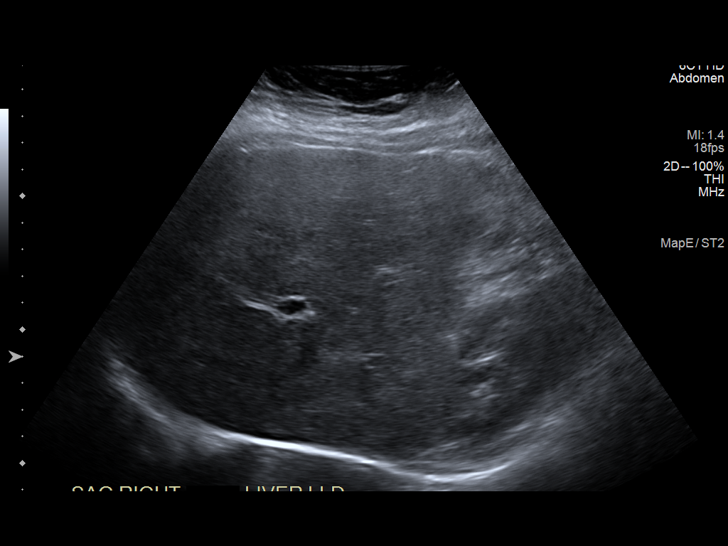
[im 43/64]
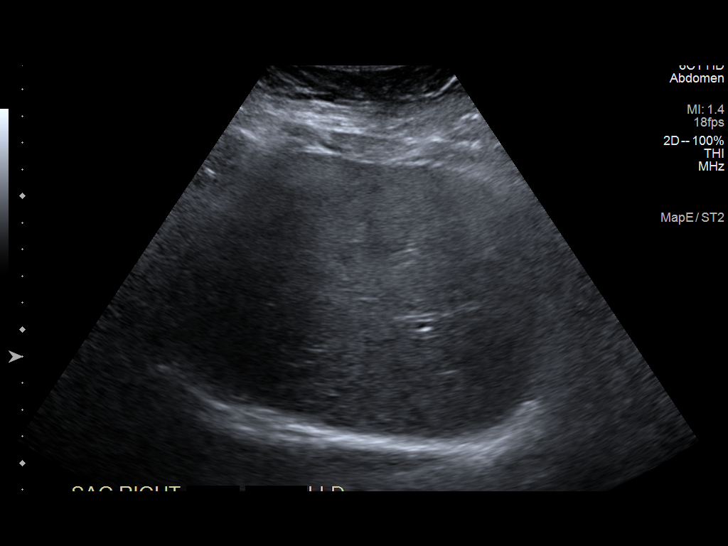
[im 48/64]
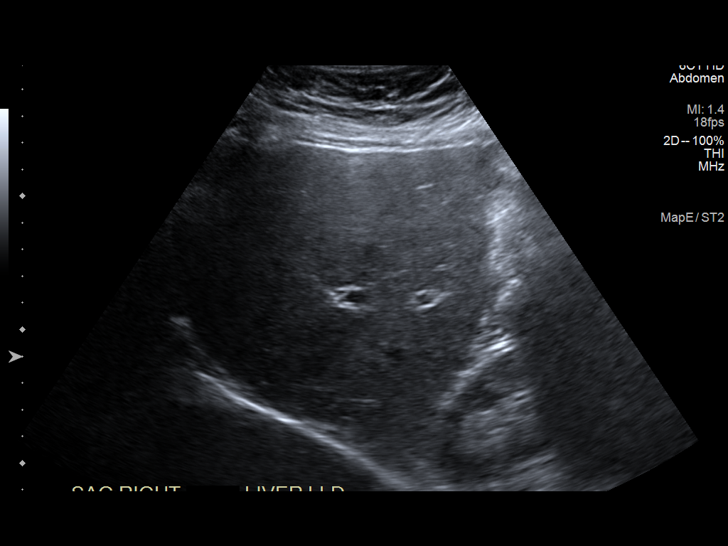
[im 53/64]
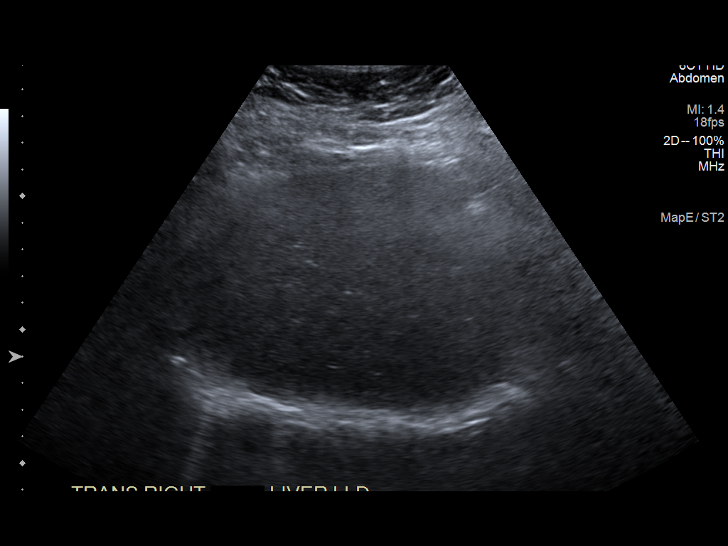
[im 58/64]
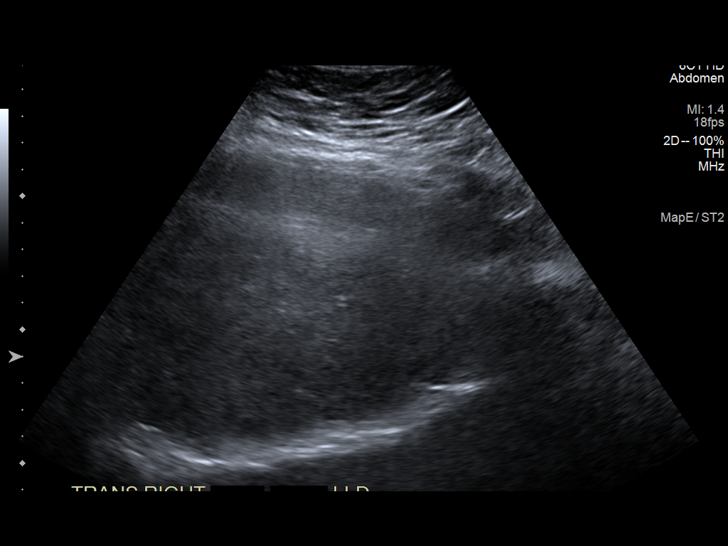
[im 64/64]
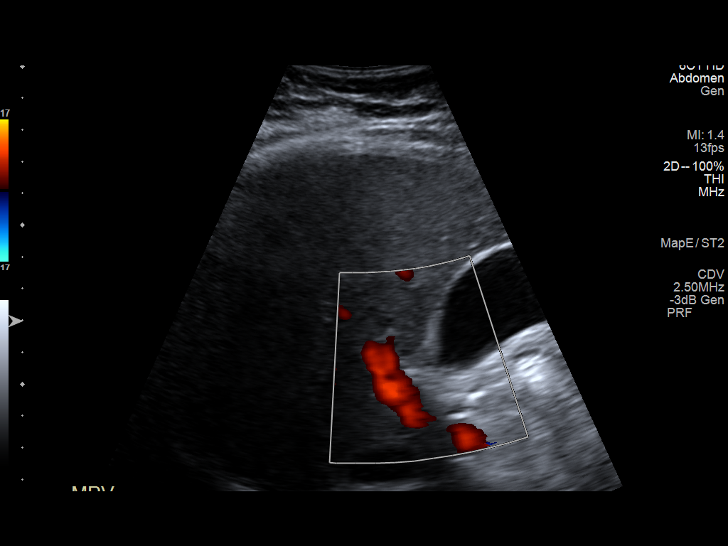

[14 of 25 positions shown; findings below may reference images not displayed]

FINDINGS: Gallbladder:

No gallstones or wall thickening visualized. No sonographic Murphy
sign noted by sonographer.

Common bile duct:

Diameter: 2.9 mm

Liver:

No focal abnormality is seen. There is slightly inhomogeneous coarse
echogenicity in the liver. Portal vein is patent on color Doppler
imaging with normal direction of blood flow towards the liver.

Other: None.
IMPRESSION: There is increased inhomogeneous echogenicity in the liver
suggesting possible fatty infiltration. No other sonographic
abnormality is seen in the right upper quadrant.

## 2022-09-16 LAB — OB RESULTS CONSOLE GBS
GBS: NEGATIVE
GBS: NEGATIVE

## 2022-10-08 ENCOUNTER — Encounter (HOSPITAL_COMMUNITY): Payer: Self-pay | Admitting: Obstetrics and Gynecology

## 2022-10-08 ENCOUNTER — Inpatient Hospital Stay (HOSPITAL_COMMUNITY): Payer: BC Managed Care – PPO | Admitting: Anesthesiology

## 2022-10-08 ENCOUNTER — Inpatient Hospital Stay (HOSPITAL_COMMUNITY)
Admission: AD | Admit: 2022-10-08 | Discharge: 2022-10-11 | DRG: 768 | Disposition: A | Discharge status: Home / Self Care | Payer: BC Managed Care – PPO | Attending: Obstetrics and Gynecology | Admitting: Obstetrics and Gynecology

## 2022-10-08 ENCOUNTER — Other Ambulatory Visit: Payer: Self-pay

## 2022-10-08 DIAGNOSIS — O139 Gestational [pregnancy-induced] hypertension without significant proteinuria, unspecified trimester: Principal | ICD-10-CM | POA: Diagnosis present

## 2022-10-08 DIAGNOSIS — O99214 Obesity complicating childbirth: Secondary | ICD-10-CM | POA: Diagnosis present

## 2022-10-08 DIAGNOSIS — O99345 Other mental disorders complicating the puerperium: Secondary | ICD-10-CM | POA: Diagnosis not present

## 2022-10-08 DIAGNOSIS — O874 Varicose veins of lower extremity in the puerperium: Secondary | ICD-10-CM | POA: Diagnosis not present

## 2022-10-08 DIAGNOSIS — F419 Anxiety disorder, unspecified: Secondary | ICD-10-CM | POA: Diagnosis present

## 2022-10-08 DIAGNOSIS — O134 Gestational [pregnancy-induced] hypertension without significant proteinuria, complicating childbirth: Secondary | ICD-10-CM | POA: Diagnosis present

## 2022-10-08 DIAGNOSIS — O133 Gestational [pregnancy-induced] hypertension without significant proteinuria, third trimester: Secondary | ICD-10-CM | POA: Diagnosis present

## 2022-10-08 DIAGNOSIS — O9953 Diseases of the respiratory system complicating the puerperium: Secondary | ICD-10-CM | POA: Diagnosis not present

## 2022-10-08 DIAGNOSIS — O9902 Anemia complicating childbirth: Secondary | ICD-10-CM | POA: Diagnosis present

## 2022-10-08 DIAGNOSIS — J45909 Unspecified asthma, uncomplicated: Secondary | ICD-10-CM | POA: Diagnosis not present

## 2022-10-08 DIAGNOSIS — O99344 Other mental disorders complicating childbirth: Secondary | ICD-10-CM | POA: Diagnosis present

## 2022-10-08 DIAGNOSIS — I8390 Asymptomatic varicose veins of unspecified lower extremity: Secondary | ICD-10-CM | POA: Diagnosis not present

## 2022-10-08 DIAGNOSIS — Z3A39 39 weeks gestation of pregnancy: Secondary | ICD-10-CM | POA: Diagnosis not present

## 2022-10-08 DIAGNOSIS — O1495 Unspecified pre-eclampsia, complicating the puerperium: Secondary | ICD-10-CM | POA: Diagnosis not present

## 2022-10-08 DIAGNOSIS — D62 Acute posthemorrhagic anemia: Secondary | ICD-10-CM | POA: Diagnosis not present

## 2022-10-08 DIAGNOSIS — M79669 Pain in unspecified lower leg: Secondary | ICD-10-CM | POA: Diagnosis not present

## 2022-10-08 LAB — COMPREHENSIVE METABOLIC PANEL
ALT: 20 U/L (ref 0–44)
AST: 33 U/L (ref 15–41)
Albumin: 2.5 g/dL — ABNORMAL LOW (ref 3.5–5.0)
Alkaline Phosphatase: 102 U/L (ref 38–126)
Anion gap: 10 (ref 5–15)
BUN: 5 mg/dL — ABNORMAL LOW (ref 6–20)
CO2: 18 mmol/L — ABNORMAL LOW (ref 22–32)
Calcium: 8.9 mg/dL (ref 8.9–10.3)
Chloride: 108 mmol/L (ref 98–111)
Creatinine, Ser: 0.7 mg/dL (ref 0.44–1.00)
GFR, Estimated: >60 mL/min (ref >60)
Glucose, Bld: 97 mg/dL (ref 70–99)
Potassium: 3.6 mmol/L (ref 3.5–5.1)
Sodium: 136 mmol/L (ref 135–145)
Total Bilirubin: 0.5 mg/dL (ref 0.3–1.2)
Total Protein: 5.7 g/dL — ABNORMAL LOW (ref 6.5–8.1)

## 2022-10-08 LAB — CBC
HCT: 37.2 % (ref 36.0–46.0)
HCT: 36.5 % (ref 36.0–46.0)
Hemoglobin: 12.9 g/dL (ref 12.0–15.0)
Hemoglobin: 12.3 g/dL (ref 12.0–15.0)
MCH: 31.2 pg (ref 26.0–34.0)
MCH: 30.8 pg (ref 26.0–34.0)
MCHC: 34.7 g/dL (ref 30.0–36.0)
MCHC: 33.7 g/dL (ref 30.0–36.0)
MCV: 89.9 fL (ref 80.0–100.0)
MCV: 91.5 fL (ref 80.0–100.0)
Platelets: 184 10*3/uL (ref 150–400)
Platelets: 180 10*3/uL (ref 150–400)
RBC: 4.14 MIL/uL (ref 3.87–5.11)
RBC: 3.99 MIL/uL (ref 3.87–5.11)
RDW: 14.2 % (ref 11.5–15.5)
RDW: 14.1 % (ref 11.5–15.5)
WBC: 10.3 10*3/uL (ref 4.0–10.5)
WBC: 14.1 10*3/uL — ABNORMAL HIGH (ref 4.0–10.5)
nRBC: 0 % (ref 0.0–0.2)
nRBC: 0 % (ref 0.0–0.2)

## 2022-10-08 LAB — URIC ACID: Uric Acid, Serum: 5.3 mg/dL (ref 2.5–7.1)

## 2022-10-08 LAB — OB RESULTS CONSOLE HIV ANTIBODY (ROUTINE TESTING): HIV: NONREACTIVE

## 2022-10-08 LAB — TYPE AND SCREEN
ABO/RH(D): A POS
Antibody Screen: NEGATIVE

## 2022-10-08 LAB — PROTEIN / CREATININE RATIO, URINE
Creatinine, Urine: 18 mg/dL
Total Protein, Urine: <6 mg/dL

## 2022-10-08 LAB — HIV ANTIBODY (ROUTINE TESTING W REFLEX): HIV Screen 4th Generation wRfx: NONREACTIVE

## 2022-10-08 LAB — OB RESULTS CONSOLE RPR: RPR: NONREACTIVE

## 2022-10-08 MED ORDER — LACTATED RINGERS IV SOLN: 500.0000 mL | Freq: Once | INTRAVENOUS | Status: DC

## 2022-10-08 MED ORDER — MISOPROSTOL 25 MCG QUARTER TABLET
25.0000 ug | ORAL_TABLET | Freq: Once | ORAL | Status: AC
  Administered 2022-10-08: 25 ug via ORAL
  Filled 2022-10-08: qty 1

## 2022-10-08 MED ORDER — OXYTOCIN BOLUS FROM INFUSION
333.0000 mL | Freq: Once | INTRAVENOUS | Status: AC
  Administered 2022-10-09: 333 mL via INTRAVENOUS

## 2022-10-08 MED ORDER — EPHEDRINE 5 MG/ML INJ: 10.0000 mg | INTRAVENOUS | Status: DC | PRN

## 2022-10-08 MED ORDER — TERBUTALINE SULFATE 1 MG/ML IJ SOLN: 0.2500 mg | Freq: Once | INTRAMUSCULAR | Status: DC | PRN

## 2022-10-08 MED ORDER — ONDANSETRON HCL 4 MG/2ML IJ SOLN
4.0000 mg | Freq: Four times a day (QID) | INTRAMUSCULAR | Status: DC | PRN
  Administered 2022-10-08 – 2022-10-09 (×4): 4 mg via INTRAVENOUS
  Filled 2022-10-08 (×4): qty 2

## 2022-10-08 MED ORDER — LIDOCAINE HCL (PF) 1 % IJ SOLN: 30.0000 mL | INTRAMUSCULAR | Status: DC | PRN

## 2022-10-08 MED ORDER — PHENYLEPHRINE 80 MCG/ML (10ML) SYRINGE FOR IV PUSH (FOR BLOOD PRESSURE SUPPORT): 80.0000 ug | PREFILLED_SYRINGE | INTRAVENOUS | Status: DC | PRN

## 2022-10-08 MED ORDER — FENTANYL-BUPIVACAINE-NACL 0.5-0.125-0.9 MG/250ML-% EP SOLN
12.0000 mL/h | EPIDURAL | Status: DC | PRN
  Administered 2022-10-08: 12 mL/h via EPIDURAL
  Filled 2022-10-08 (×2): qty 250

## 2022-10-08 MED ORDER — SOD CITRATE-CITRIC ACID 500-334 MG/5ML PO SOLN: 30.0000 mL | ORAL | Status: DC | PRN

## 2022-10-08 MED ORDER — MISOPROSTOL 25 MCG QUARTER TABLET
25.0000 ug | ORAL_TABLET | Freq: Once | ORAL | Status: AC
  Administered 2022-10-08: 25 ug via VAGINAL
  Filled 2022-10-08: qty 1

## 2022-10-08 MED ORDER — OXYCODONE-ACETAMINOPHEN 5-325 MG PO TABS: 2.0000 | ORAL_TABLET | ORAL | Status: DC | PRN

## 2022-10-08 MED ORDER — OXYCODONE-ACETAMINOPHEN 5-325 MG PO TABS: 1.0000 | ORAL_TABLET | ORAL | Status: DC | PRN

## 2022-10-08 MED ORDER — LACTATED RINGERS IV SOLN: INTRAVENOUS | Status: DC

## 2022-10-08 MED ORDER — DIPHENHYDRAMINE HCL 50 MG/ML IJ SOLN: 12.5000 mg | INTRAMUSCULAR | Status: DC | PRN

## 2022-10-08 MED ORDER — MISOPROSTOL 50MCG HALF TABLET
50.0000 ug | ORAL_TABLET | Freq: Once | ORAL | Status: AC
  Administered 2022-10-08: 50 ug via ORAL
  Filled 2022-10-08: qty 1

## 2022-10-08 MED ORDER — FENTANYL CITRATE (PF) 100 MCG/2ML IJ SOLN
50.0000 ug | INTRAMUSCULAR | Status: DC | PRN
  Administered 2022-10-08 (×2): 100 ug via INTRAVENOUS
  Filled 2022-10-08 (×2): qty 2

## 2022-10-08 MED ORDER — ACETAMINOPHEN 325 MG PO TABS
650.0000 mg | ORAL_TABLET | ORAL | Status: DC | PRN
  Administered 2022-10-08 – 2022-10-09 (×2): 650 mg via ORAL
  Filled 2022-10-08 (×2): qty 2

## 2022-10-08 MED ORDER — LACTATED RINGERS IV SOLN
500.0000 mL | INTRAVENOUS | Status: DC | PRN
  Administered 2022-10-09: 1000 mL via INTRAVENOUS

## 2022-10-08 MED ORDER — OXYTOCIN-SODIUM CHLORIDE 30-0.9 UT/500ML-% IV SOLN
2.5000 [IU]/h | INTRAVENOUS | Status: DC
  Filled 2022-10-08: qty 500

## 2022-10-08 MED ORDER — LIDOCAINE HCL (PF) 1 % IJ SOLN
INTRAMUSCULAR | Status: DC | PRN
  Administered 2022-10-08: 11 mL via EPIDURAL

## 2022-10-08 NOTE — Anesthesia Preprocedure Evaluation (Signed)
Anesthesia Evaluation  Patient identified by MRN, date of birth, ID band Patient awake    Reviewed: Allergy & Precautions, H&P , NPO status , Patient's Chart, lab work & pertinent test results  Airway Mallampati: II  TM Distance: >3 FB Neck ROM: Full    Dental no notable dental hx.    Pulmonary asthma    Pulmonary exam normal breath sounds clear to auscultation       Cardiovascular hypertension, Normal cardiovascular exam Rhythm:Regular Rate:Normal     Neuro/Psych  Headaches  negative psych ROS   GI/Hepatic negative GI ROS, Neg liver ROS,,,  Endo/Other  negative endocrine ROS    Renal/GU negative Renal ROS  negative genitourinary   Musculoskeletal negative musculoskeletal ROS (+)    Abdominal  (+) + obese  Peds negative pediatric ROS (+)  Hematology negative hematology ROS (+)   Anesthesia Other Findings   Reproductive/Obstetrics (+) Pregnancy                             Anesthesia Physical Anesthesia Plan  ASA: 2  Anesthesia Plan: Epidural   Post-op Pain Management:    Induction: Intravenous  PONV Risk Score and Plan:   Airway Management Planned:   Additional Equipment:   Intra-op Plan:   Post-operative Plan:   Informed Consent: I have reviewed the patients History and Physical, chart, labs and discussed the procedure including the risks, benefits and alternatives for the proposed anesthesia with the patient or authorized representative who has indicated his/her understanding and acceptance.       Plan Discussed with:   Anesthesia Plan Comments:        Anesthesia Quick Evaluation

## 2022-10-08 NOTE — Progress Notes (Signed)
Patient ID: LAQUASHA NAVIA, female   DOB: 1990/10/25, 32 y.o.   MRN: [id number] Pt ready for AROM now. Had requested to wait after foley came out. No HAs BP 145/83 GEN - NAD, anxious  EFM - 120s, moderate variability, variable decels occasionally, no late decels  TOCO - ctxs q 1-34mins  SVE - 6/80/-2  A/P: Progressing well in labor          - AROM with moderate return of light meconium stained fluid          Monitor BP          Continue with expectant mgmt

## 2022-10-08 NOTE — Progress Notes (Signed)
Patient ID: Margaret Cunningham, female   DOB: 10/11/1990, 32 y.o.   MRN: [id number] Foley still in place.  Pt got 74mcg cytotec orally at 16:13  BP stable  EFM - 135, mod variability, no decels, cat 1 TOCO - ctxs not clearly seen  SVE  - deferred  Plan : Continue with labor induction            AROM once foley out            Continue pitocin per protocol           Monitor BP

## 2022-10-08 NOTE — Anesthesia Procedure Notes (Addendum)
Epidural Patient location during procedure: OB Start time: 10/08/2022 10:27 PM End time: 10/08/2022 10:41 PM  Staffing Anesthesiologist: Lynda Rainwater, MD Performed: anesthesiologist   Preanesthetic Checklist Completed: patient identified, IV checked, site marked, risks and benefits discussed, surgical consent, monitors and equipment checked, pre-op evaluation and timeout performed  Epidural Patient position: sitting Prep: ChloraPrep Patient monitoring: heart rate, cardiac monitor, continuous pulse ox and blood pressure Approach: midline Location: L2-L3 Injection technique: LOR saline  Needle:  Needle type: Tuohy  Needle gauge: 17 G Needle length: 9 cm Needle insertion depth: 6 cm Catheter type: closed end flexible Catheter size: 20 Guage Catheter at skin depth: 11 cm Test dose: negative  Assessment Events: blood not aspirated, injection not painful, no injection resistance, no paresthesia and negative IV test  Additional Notes Epidural placed by SRNA under direct supervisionReason for block:procedure for pain

## 2022-10-08 NOTE — H&P (Signed)
Margaret Cunningham is a 32 y.o.prime  female presenting for direct admission at 27 5/7wks for PIH. Pt had elevated BP in office yesterday and today. PReE labs wnl. Upr/cr 0.26. She is asymptomatic GBS neg LLP resolved in pregnancy. Maternal obesity ( baby asa) and anxiety in pregnancy (lexapro). Hx asthma .  NIPT expected range; Inheritest neg  OB History     Gravida  1   Para      Term      Preterm      AB      Living         SAB      IAB      Ectopic      Multiple      Live Births             Past Medical History:  Diagnosis Date   Allergy    Asthma    esp in childhood   Migraine    Past Surgical History:  Procedure Laterality Date   CERVICAL POLYPECTOMY N/A 02/09/2019   Procedure: CERVICAL POLYPECTOMY;  Surgeon: Vanessa Kick, MD;  Location: [facility];  Service: Gynecology;  Laterality: N/A;   HYSTEROSCOPY WITH D & C N/A 02/09/2019   Procedure: DILATATION AND CURETTAGE /HYSTEROSCOPY;  Surgeon: Vanessa Kick, MD;  Location: [place];  Service: Gynecology;  Laterality: N/A;   WISDOM TOOTH EXTRACTION     Family History: family history is not on file. Social History:  reports that she has never smoked. She has never used smokeless tobacco. She reports that she does not drink alcohol and does not use drugs.     Maternal Diabetes: No Genetic Screening: Normal Maternal Ultrasounds/Referrals: Normal Fetal Ultrasounds or other Referrals:  None Maternal Substance Abuse:  No Significant Maternal Medications:  None Significant Maternal Lab Results:  Group B Strep negative Number of Prenatal Visits:greater than 3 verified prenatal visits Other Comments:  None  Review of Systems  Constitutional:  Negative for activity change, fatigue and fever.  Eyes:  Negative for photophobia and visual disturbance.  Respiratory:  Negative for chest tightness and shortness of breath.   Cardiovascular:  Negative for chest pain and palpitations.   Gastrointestinal:  Negative for abdominal pain.  Musculoskeletal:  Negative for back pain.  Neurological:  Negative for light-headedness and headaches.   Maternal Medical History:  Reason for admission: PIH  Contractions: Frequency: rare.   Perceived severity is mild.   Fetal activity: Perceived fetal activity is normal.   Prenatal complications: PIH.   Prenatal Complications - Diabetes: none.     Blood pressure (!) 146/85, pulse (!) 113, last menstrual period 07/16/2021. Maternal Exam:  Uterine Assessment: Contraction strength is mild.  Contraction frequency is rare.  Abdomen: Patient reports generalized tenderness.  Estimated fetal weight is AGA.   Fetal presentation: vertex Introitus: Normal vulva. Vulva is negative for condylomata and lesion.  Normal vagina.  Vagina is negative for condylomata.  Cervix: Cervix evaluated by digital exam.     Fetal Exam Fetal Monitor Review: Baseline rate: 145.  Variability: moderate (6-25 bpm).   Pattern: accelerations present and no decelerations.   Fetal State Assessment: Category I - tracings are normal.   Physical Exam Vitals and nursing note reviewed. Exam conducted with a chaperone present.  Constitutional:      Appearance: Normal appearance. She is normal weight.  Cardiovascular:     Pulses: Normal pulses.  Pulmonary:     Effort: Pulmonary effort is normal.  Abdominal:  Tenderness: There is generalized abdominal tenderness.  Genitourinary:    General: Normal vulva.  Vulva is no lesion.  Musculoskeletal:        General: Normal range of motion.     Cervical back: Normal range of motion.  Skin:    General: Skin is warm and dry.     Capillary Refill: Capillary refill takes 2 to 3 seconds.  Neurological:     General: No focal deficit present.     Mental Status: She is alert and oriented to person, place, and time. Mental status is at baseline.  Psychiatric:        Mood and Affect: Mood normal.        Behavior:  Behavior normal.        Thought Content: Thought content normal.        Judgment: Judgment normal.     Prenatal labs: ABO, Rh:   Antibody:   Rubella:   RPR:    HBsAg:    HIV:    GBS:     Assessment/Plan: 31yo prime at 18 5/7wks with PIH here for direct admission - IOL with cytotec orally/vaginally - Pain control prn  - GBS neg - Monitor BP and treat accordingly - Anticipate svd    Venetia Night Carollynn Pennywell 10/08/2022, 11:06 AM

## 2022-10-08 NOTE — Progress Notes (Signed)
Patient ID: Margaret Cunningham, female   DOB: September 22, 1990, 32 y.o.   MRN: [id number] Cooks foley catheter placed with 60u/30v. Pt tolerated with some discomfort - mild  Was 1/80/-2cm dil and not appeciating contractions. C/O HA - tylenol given   Expectant mgmt

## 2022-10-09 ENCOUNTER — Encounter (HOSPITAL_COMMUNITY): Payer: Self-pay | Admitting: Obstetrics and Gynecology

## 2022-10-09 DIAGNOSIS — O133 Gestational [pregnancy-induced] hypertension without significant proteinuria, third trimester: Secondary | ICD-10-CM | POA: Diagnosis present

## 2022-10-09 LAB — CBC
HCT: 32.4 % — ABNORMAL LOW (ref 36.0–46.0)
Hemoglobin: 11.1 g/dL — ABNORMAL LOW (ref 12.0–15.0)
MCH: 31.4 pg (ref 26.0–34.0)
MCHC: 34.3 g/dL (ref 30.0–36.0)
MCV: 91.5 fL (ref 80.0–100.0)
Platelets: 202 10*3/uL (ref 150–400)
RBC: 3.54 MIL/uL — ABNORMAL LOW (ref 3.87–5.11)
RDW: 14.3 % (ref 11.5–15.5)
WBC: 26.1 10*3/uL — ABNORMAL HIGH (ref 4.0–10.5)
nRBC: 0 % (ref 0.0–0.2)

## 2022-10-09 LAB — RPR: RPR Ser Ql: NONREACTIVE

## 2022-10-09 MED ORDER — ACETAMINOPHEN 325 MG PO TABS: 650.0000 mg | ORAL_TABLET | ORAL | Status: DC | PRN

## 2022-10-09 MED ORDER — TRANEXAMIC ACID-NACL 1000-0.7 MG/100ML-% IV SOLN: 1000.0000 mg | INTRAVENOUS | Status: DC

## 2022-10-09 MED ORDER — BENZOCAINE-MENTHOL 20-0.5 % EX AERO
1.0000 | INHALATION_SPRAY | CUTANEOUS | Status: DC | PRN
  Administered 2022-10-09: 1 via TOPICAL
  Filled 2022-10-09: qty 56

## 2022-10-09 MED ORDER — SODIUM CHLORIDE 0.9 % IV SOLN
3.0000 g | Freq: Four times a day (QID) | INTRAVENOUS | Status: AC
  Administered 2022-10-09 (×4): 3 g via INTRAVENOUS
  Filled 2022-10-09 (×2): qty 8
  Filled 2022-10-09: qty 3
  Filled 2022-10-09 (×3): qty 8

## 2022-10-09 MED ORDER — METHYLERGONOVINE MALEATE 0.2 MG/ML IJ SOLN
INTRAMUSCULAR | Status: AC
  Filled 2022-10-09: qty 1

## 2022-10-09 MED ORDER — ONDANSETRON HCL 4 MG PO TABS: 4.0000 mg | ORAL_TABLET | ORAL | Status: DC | PRN

## 2022-10-09 MED ORDER — CARBOPROST TROMETHAMINE 250 MCG/ML IM SOLN
250.0000 ug | Freq: Once | INTRAMUSCULAR | Status: AC
  Administered 2022-10-09: 250 ug via INTRAMUSCULAR

## 2022-10-09 MED ORDER — TRANEXAMIC ACID-NACL 1000-0.7 MG/100ML-% IV SOLN
INTRAVENOUS | Status: AC
  Administered 2022-10-09: 1000 mg
  Filled 2022-10-09: qty 100

## 2022-10-09 MED ORDER — CARBOPROST TROMETHAMINE 250 MCG/ML IM SOLN
INTRAMUSCULAR | Status: AC
  Filled 2022-10-09: qty 1

## 2022-10-09 MED ORDER — ONDANSETRON HCL 4 MG/2ML IJ SOLN: 4.0000 mg | INTRAMUSCULAR | Status: DC | PRN

## 2022-10-09 MED ORDER — SIMETHICONE 80 MG PO CHEW: 80.0000 mg | CHEWABLE_TABLET | ORAL | Status: DC | PRN

## 2022-10-09 MED ORDER — PRENATAL MULTIVITAMIN CH
1.0000 | ORAL_TABLET | Freq: Every day | ORAL | Status: DC
  Administered 2022-10-09 – 2022-10-10 (×2): 1 via ORAL
  Filled 2022-10-09 (×3): qty 1

## 2022-10-09 MED ORDER — DIPHENOXYLATE-ATROPINE 2.5-0.025 MG PO TABS
1.0000 | ORAL_TABLET | Freq: Once | ORAL | Status: AC
  Administered 2022-10-09: 1 via ORAL
  Filled 2022-10-09: qty 1

## 2022-10-09 MED ORDER — IBUPROFEN 600 MG PO TABS
600.0000 mg | ORAL_TABLET | Freq: Four times a day (QID) | ORAL | Status: DC
  Administered 2022-10-09 – 2022-10-11 (×8): 600 mg via ORAL
  Filled 2022-10-09 (×8): qty 1

## 2022-10-09 MED ORDER — WITCH HAZEL-GLYCERIN EX PADS: 1.0000 | MEDICATED_PAD | CUTANEOUS | Status: DC | PRN

## 2022-10-09 MED ORDER — ZOLPIDEM TARTRATE 5 MG PO TABS: 5.0000 mg | ORAL_TABLET | Freq: Every evening | ORAL | Status: DC | PRN

## 2022-10-09 MED ORDER — COCONUT OIL OIL: 1.0000 | TOPICAL_OIL | Status: DC | PRN

## 2022-10-09 MED ORDER — SENNOSIDES-DOCUSATE SODIUM 8.6-50 MG PO TABS
2.0000 | ORAL_TABLET | ORAL | Status: DC
  Administered 2022-10-10: 2 via ORAL
  Filled 2022-10-09 (×2): qty 2

## 2022-10-09 MED ORDER — TETANUS-DIPHTH-ACELL PERTUSSIS 5-2.5-18.5 LF-MCG/0.5 IM SUSY: 0.5000 mL | PREFILLED_SYRINGE | Freq: Once | INTRAMUSCULAR | Status: DC

## 2022-10-09 MED ORDER — DIPHENHYDRAMINE HCL 25 MG PO CAPS: 25.0000 mg | ORAL_CAPSULE | Freq: Four times a day (QID) | ORAL | Status: DC | PRN

## 2022-10-09 MED ORDER — DIBUCAINE (PERIANAL) 1 % EX OINT: 1.0000 | TOPICAL_OINTMENT | CUTANEOUS | Status: DC | PRN

## 2022-10-09 NOTE — Lactation Note (Signed)
This note was copied from a baby's chart. Lactation Consultation Note  Patient Name: Margaret Cunningham [id number] Date: 10/09/2022 Age:32 hours Reason for consult: Initial assessment;Term;Primapara;1st time breastfeeding  LC in to room for initial visit. Birthing parent states she is going to formula feed exclusively. LC briefly discussed breast changes consistent with lactogenesis II and relief options. Parents shows appreciation for information. Mother politely declines future [facility] visits. Encouraged to contact [facility] for questions or concerns as needed.   Maternal Data Has patient been taught Hand Expression?: No Does the patient have breastfeeding experience prior to this delivery?: No  Feeding Mother's Current Feeding Choice: Formula  Interventions Interventions: [place] [facility] brochure  Discharge Discharge Education: Engorgement and breast care Pump: Personal  Consult Status Consult Status: Complete    Jazarah Capili A Higuera Ancidey 10/09/2022, 12:47 PM

## 2022-10-09 NOTE — Progress Notes (Signed)
Patient ID: Margaret Cunningham, female   DOB: [birth year], 32 y.o.   MRN: [id number]  Pt observed for one hour with Jada to suction and suction d/c and continued in place an additional 30-40 minutes with no excessive bleeding  Jada deflated and removed with no issues and uterus firm with no heavy bleeding.  Will observe x 30 minutes and if stable may go to pp floor. Pt was slightly tachycardic to 130-150 after delivery (she states is her norm)  so a fluid blous given and CBC drawn.  Hgb  11.1 although has not fully equilibrated seems to be stable.  HR closer to her baseline now, so will follow Continue unasyn x 24 hours  Desires neonatal circumcision, R/B/A of procedure discussed at length. Pt understands that neonatal circumcision is not considered medically necessary and is elective. The risks include, but are not limited to bleeding, infection, damage to the penis, development of scar tissue, and having to have it redone at a later date. Pt understands theses risks and wishes to proceed. Plan for tomorrow

## 2022-10-09 NOTE — Anesthesia Postprocedure Evaluation (Signed)
Anesthesia Post Note  Patient: Margaret Cunningham  Procedure(s) Performed: AN AD HOC LABOR EPIDURAL     Patient location during evaluation: Mother Baby Anesthesia Type: Epidural Level of consciousness: awake and alert Pain management: pain level controlled Vital Signs Assessment: post-procedure vital signs reviewed and stable Respiratory status: spontaneous breathing, nonlabored ventilation and respiratory function stable Cardiovascular status: stable Postop Assessment: no headache, no backache, epidural receding, no apparent nausea or vomiting, patient able to bend at knees, able to ambulate and adequate PO intake Anesthetic complications: no   No notable events documented.  Last Vitals:  Vitals:   10/09/22 1052 10/09/22 1148  BP: 128/70 109/69  Pulse: (!) 101 (!) 104  Resp: 20 20  Temp: 37.1 C 37 C  SpO2: 97% 98%    Last Pain:  Vitals:   10/09/22 1148  TempSrc: Oral  PainSc: 0-No pain   Pain Goal:                   [employer]

## 2022-10-09 NOTE — Progress Notes (Signed)
Patient ID: Margaret Cunningham, female   DOB: [birth year], 32 y.o.   MRN: [id number] Pt reports pelvic pressure.  Noted to be completely dilated  Temp noted at 100.7  Will start on unasyn x 24hrs Continue to push; anticipate svd

## 2022-10-10 LAB — CBC
HCT: 23.8 % — ABNORMAL LOW (ref 36.0–46.0)
Hemoglobin: 8.3 g/dL — ABNORMAL LOW (ref 12.0–15.0)
MCH: 32 pg (ref 26.0–34.0)
MCHC: 34.9 g/dL (ref 30.0–36.0)
MCV: 91.9 fL (ref 80.0–100.0)
Platelets: 156 10*3/uL (ref 150–400)
RBC: 2.59 MIL/uL — ABNORMAL LOW (ref 3.87–5.11)
RDW: 14.7 % (ref 11.5–15.5)
WBC: 18.9 10*3/uL — ABNORMAL HIGH (ref 4.0–10.5)
nRBC: 0 % (ref 0.0–0.2)

## 2022-10-10 MED ORDER — FERROUS SULFATE 325 (65 FE) MG PO TABS
325.0000 mg | ORAL_TABLET | Freq: Every day | ORAL | Status: DC
  Administered 2022-10-11: 325 mg via ORAL
  Filled 2022-10-10: qty 1

## 2022-10-10 NOTE — Plan of Care (Signed)

## 2022-10-10 NOTE — Progress Notes (Signed)
Patient is eating, ambulating, voiding.  Pain control is good. No fever/chills.  Appropriate lochia, no complaints.  Vitals:   10/09/22 1605 10/09/22 1938 10/09/22 2342 10/10/22 0523  BP: 124/74 127/67 (!) 108/58 111/72  Pulse: (!) 113 (!) 114 89 95  Resp: 20 18 18 18   Temp: 98.8 F (37.1 C) 98.9 F (37.2 C) 98.2 F (36.8 C) 98.2 F (36.8 C)  TempSrc: Oral Oral Oral   SpO2: 98% 97% 98% 99%  Weight:      Height:       Abd: soft, nontender Ext: no calf tenderness  Lab Results  Component Value Date   WBC 18.9 (H) 10/10/2022   HGB 8.3 (L) 10/10/2022   HCT 23.8 (L) 10/10/2022   MCV 91.9 10/10/2022   PLT 156 10/10/2022    --/--/A POS (03/21 1103)  A/P Post partum day 1 Acute blood loss anemia: Hb 8.3, Fe ordered Intrapartum fever; s/p Unasyn Routine care.    Allyn Kenner

## 2022-10-10 NOTE — Progress Notes (Signed)
CSW received consult for hx of Anxiety and Depression, answering positive to question 10 on Edinburgh Postnatal Depression Scale, and history of self harm. CSW met with MOB to offer support and complete assessment. When CSW entered room, FOB was present. CSW introduced self and requested to speak with MOB alone. FOB left room. CSW explained reason for consult. MOB presented as calm, was agreeable to consult and remained engaged throughout encounter.   CSW inquired how MOB has felt emotionally since infant's arrival. MOB states she is feeling "good." CSW inquired about MOB's mental health history. MOB states she does have a history of anxiety but has not received a formal diagnosis. MOB recalls "always" feeling symptoms of anxiety but states she began experiencing more significant anxiety in High School when her parents were going through a divorce. MOB states she has attended therapy in the past "on and off", sharing she last met with a therapist in 2020. MOB added that therapy is expensive. CSW inquired about Lexapro prescription noted in chart review. MOB states she was prescribed Lexapro by her OBGYN during her pregnancy but has not started the medication. MOB shares she feels she has been able to cope without medication.   CSW inquired about history of self harm. MOB states that she chose the answer "hardly ever" to question 10 on the Edinburgh Postnatal Depression Scale questionnaire because she wanted to keep her care team informed about her history of self harm. MOB states that she used to cut herself as a way to distract herself. MOB states the last time she engaged in cutting behavior was in college. MOB shares that as a coping skill, she will squeeze a plastic knife to give herself a similar sensation without causing harm to herself. MOB states that when she has used this coping mechanism, she has not drawn blood. MOB states that she last squeezed a plastic knife to cope with challenging emotions 1-2  months ago when she was experiencing a stressor at work and wanted to cry. MOB shares that she squeezed the plastic knife to emotionally distract herself so that she would not cry. MOB denies history of suicidal ideation and denies current SI/HI/DV. CSW commended MOB for sharing openly about her mental health with her care team.   CSW inquired about anxiety symptoms during pregnancy. MOB shares that she experienced infertility for 8 years prior to becoming pregnant with infant, sharing that she experienced anxiety in the beginning of her pregnancy due to fear of miscarriage and again at the end of her pregnancy due to one of her close friends having a stillborn. MOB shared that it has helped her to ask her care team questions for reassurance when she is feeling anxious or out of control.   CSW provided education regarding the baby blues period vs. perinatal mood disorders, discussed treatment and gave resources for mental health follow up if concerns arise.  CSW recommends self-evaluation during the postpartum time period using the New Mom Checklist from Postpartum Progress and encouraged MOB to contact a medical professional if symptoms are noted at any time.    MOB reports she has all needed items for infant, including a car seat and bassinet. MOB is undecided on infant's pediatrician but has an initial appointment scheduled at [facility] for [place]. MOB states she has a pediatrician list if she decides to change practices.   CSW provided review of Sudden Infant Death Syndrome (SIDS) precautions.    CSW identifies no further need for intervention and no  barriers to discharge at this time.  Signed,  Berniece Salines, MSW, [place], [place] 10/10/2022 6:19 PM

## 2022-10-11 NOTE — Plan of Care (Signed)
Problem: Education: Goal: Knowledge of General Education information will improve Description: Including pain rating scale, medication(s)/side effects and non-pharmacologic comfort measures 10/11/2022 1011 by Rodolph Bong, LPN Outcome: Adequate for Discharge 10/11/2022 0710 by Rodolph Bong, LPN Outcome: Progressing   Problem: Health Behavior/Discharge Planning: Goal: Ability to manage health-related needs will improve 10/11/2022 1011 by Rodolph Bong, LPN Outcome: Adequate for Discharge 10/11/2022 0710 by Rodolph Bong, LPN Outcome: Progressing   Problem: Clinical Measurements: Goal: Ability to maintain clinical measurements within normal limits will improve 10/11/2022 1011 by Rodolph Bong, LPN Outcome: Adequate for Discharge 10/11/2022 0710 by Rodolph Bong, LPN Outcome: Progressing Goal: Will remain free from infection 10/11/2022 1011 by Rodolph Bong, LPN Outcome: Adequate for Discharge 10/11/2022 0710 by Rodolph Bong, LPN Outcome: Progressing Goal: Diagnostic test results will improve 10/11/2022 1011 by Rodolph Bong, LPN Outcome: Adequate for Discharge 10/11/2022 0710 by Rodolph Bong, LPN Outcome: Progressing Goal: Respiratory complications will improve 10/11/2022 1011 by Rodolph Bong, LPN Outcome: Adequate for Discharge 10/11/2022 0710 by Rodolph Bong, LPN Outcome: Progressing Goal: Cardiovascular complication will be avoided 10/11/2022 1011 by Rodolph Bong, LPN Outcome: Adequate for Discharge 10/11/2022 0710 by Rodolph Bong, LPN Outcome: Progressing   Problem: Activity: Goal: Risk for activity intolerance will decrease 10/11/2022 1011 by Rodolph Bong, LPN Outcome: Adequate for Discharge 10/11/2022 0710 by Rodolph Bong, LPN Outcome: Progressing   Problem: Nutrition: Goal: Adequate nutrition will be maintained 10/11/2022 1011 by Rodolph Bong, LPN Outcome: Adequate for Discharge 10/11/2022 0710 by Rodolph Bong, LPN Outcome: Progressing    Problem: Coping: Goal: Level of anxiety will decrease 10/11/2022 1011 by Rodolph Bong, LPN Outcome: Adequate for Discharge 10/11/2022 0710 by Rodolph Bong, LPN Outcome: Progressing   Problem: Elimination: Goal: Will not experience complications related to bowel motility 10/11/2022 1011 by Rodolph Bong, LPN Outcome: Adequate for Discharge 10/11/2022 0710 by Rodolph Bong, LPN Outcome: Progressing Goal: Will not experience complications related to urinary retention 10/11/2022 1011 by Rodolph Bong, LPN Outcome: Adequate for Discharge 10/11/2022 0710 by Rodolph Bong, LPN Outcome: Progressing   Problem: Pain Managment: Goal: General experience of comfort will improve 10/11/2022 1011 by Rodolph Bong, LPN Outcome: Adequate for Discharge 10/11/2022 0710 by Rodolph Bong, LPN Outcome: Progressing   Problem: Safety: Goal: Ability to remain free from injury will improve 10/11/2022 1011 by Rodolph Bong, LPN Outcome: Adequate for Discharge 10/11/2022 0710 by Rodolph Bong, LPN Outcome: Progressing   Problem: Skin Integrity: Goal: Risk for impaired skin integrity will decrease 10/11/2022 1011 by Rodolph Bong, LPN Outcome: Adequate for Discharge 10/11/2022 0710 by Rodolph Bong, LPN Outcome: Progressing   Problem: Education: Goal: Knowledge of condition will improve 10/11/2022 1011 by Rodolph Bong, LPN Outcome: Adequate for Discharge 10/11/2022 0710 by Rodolph Bong, LPN Outcome: Progressing Goal: Individualized Educational Video(s) 10/11/2022 1011 by Rodolph Bong, LPN Outcome: Adequate for Discharge 10/11/2022 0710 by Rodolph Bong, LPN Outcome: Progressing Goal: Individualized Newborn Educational Video(s) 10/11/2022 1011 by Rodolph Bong, LPN Outcome: Adequate for Discharge 10/11/2022 0710 by Rodolph Bong, LPN Outcome: Progressing   Problem: Activity: Goal: Will verbalize the importance of balancing activity with adequate rest periods 10/11/2022 1011 by Rodolph Bong, LPN Outcome: Adequate for Discharge 10/11/2022 0710 by Rodolph Bong, LPN Outcome: Progressing Goal: Ability to tolerate increased activity will improve 10/11/2022 1011 by Rodolph Bong, LPN Outcome: Adequate for  Discharge 10/11/2022 0710 by Rodolph Bong, LPN Outcome: Progressing   Problem: Coping: Goal: Ability to identify and utilize available resources and services will improve 10/11/2022 1011 by Rodolph Bong, LPN Outcome: Adequate for Discharge 10/11/2022 0710 by Rodolph Bong, LPN Outcome: Progressing   Problem: Life Cycle: Goal: Chance of risk for complications during the postpartum period will decrease 10/11/2022 1011 by Rodolph Bong, LPN Outcome: Adequate for Discharge 10/11/2022 0710 by Rodolph Bong, LPN Outcome: Progressing   Problem: Role Relationship: Goal: Ability to demonstrate positive interaction with newborn will improve 10/11/2022 1011 by Rodolph Bong, LPN Outcome: Adequate for Discharge 10/11/2022 0710 by Rodolph Bong, LPN Outcome: Progressing   Problem: Skin Integrity: Goal: Demonstration of wound healing without infection will improve 10/11/2022 1011 by Rodolph Bong, LPN Outcome: Adequate for Discharge 10/11/2022 0710 by Rodolph Bong, LPN Outcome: Progressing

## 2022-10-11 NOTE — Plan of Care (Signed)

## 2022-10-11 NOTE — Discharge Instructions (Signed)
WHAT TO LOOK OUT FOR: Fever of 100.4 or above Mastitis: feels like flu and breasts hurt Infection: increased pain, swelling or redness Blood clots golf ball size or larger Postpartum depression   Congratulations on your newest addition!

## 2022-10-11 NOTE — Discharge Summary (Signed)
Postpartum Discharge Summary     Patient Name: Margaret Cunningham DOB: 1990-10-20 MRN: [id number]  Date of admission: 10/08/2022 Delivery date:10/09/2022  Delivering provider: Carlynn Purl [facility]  Date of discharge: 10/11/2022  Admitting diagnosis: PIH (pregnancy induced hypertension) [O13.9] Gestational hypertension without significant proteinuria during pregnancy in third trimester, antepartum [O13.3] Intrauterine pregnancy: [redacted]w[redacted]d     Secondary diagnosis:  Principal Problem:   PIH (pregnancy induced hypertension) Active Problems:   Gestational hypertension without significant proteinuria during pregnancy in third trimester, antepartum  Additional problems: obese, maternal fever treated with abx, acute blood loss anemia    Discharge diagnosis: Term Pregnancy Delivered, Gestational Hypertension, Anemia, and PPH                                              Post partum procedures: none Augmentation: Cytotec and IP Foley AROM Complications: [id number]  Hospital course: Induction of Labor With Vaginal Delivery   32 y.o. yo G1P1001 at [redacted]w[redacted]d was admitted to the hospital 10/08/2022 for induction of labor.  Indication for induction: Gestational hypertension.  Patient had an labor course complicated by PPH, treated with TXA, hemabate, JADA Membrane Rupture Time/Date: 9:48 PM ,10/08/2022   Delivery Method:Vaginal, Spontaneous  Episiotomy: None  Lacerations:  2nd degree;Perineal  Details of delivery can be found in separate delivery note.  Patient had a postpartum course complicated by acute blood loss anemia. Patient is discharged home 10/11/22.  Newborn Data: Birth date:10/09/2022  Birth time:6:50 AM  Gender:Female  Living status:Living  Apgars:9 ,9  Weight:4082 g   Magnesium Sulfate received: No BMZ received: No Rhophylac:N/A Transfusion:No  Physical exam  Vitals:   10/10/22 0523 10/10/22 1558 10/10/22 2051 10/11/22 0604  BP: 111/72 113/71 117/73 128/77  Pulse: 95 81  94 76  Resp: 18 18 16 18   Temp: 98.2 F (36.8 C) 98.1 F (36.7 C) 98.1 F (36.7 C) 97.7 F (36.5 C)  TempSrc:  Oral Oral Oral  SpO2: 99% 100% 97%   Weight:      Height:       General: alert, cooperative, and no distress Lochia: appropriate Uterine Fundus: firm Incision: N/A DVT Evaluation: No evidence of DVT seen on physical exam. Labs: Lab Results  Component Value Date   WBC 18.9 (H) 10/10/2022   HGB 8.3 (L) 10/10/2022   HCT 23.8 (L) 10/10/2022   MCV 91.9 10/10/2022   PLT 156 10/10/2022      Latest Ref Rng & Units 10/08/2022   11:03 AM  CMP  Glucose 70 - 99 mg/dL 97   BUN 6 - 20 mg/dL 5   Creatinine 0.44 - 1.00 mg/dL 0.70   Sodium 135 - 145 mmol/L 136   Potassium 3.5 - 5.1 mmol/L 3.6   Chloride 98 - 111 mmol/L 108   CO2 22 - 32 mmol/L 18   Calcium 8.9 - 10.3 mg/dL 8.9   Total Protein 6.5 - 8.1 g/dL 5.7   Total Bilirubin 0.3 - 1.2 mg/dL 0.5   Alkaline Phos 38 - 126 U/L 102   AST 15 - 41 U/L 33   ALT 0 - 44 U/L 20    Edinburgh Score:    10/09/2022   11:43 PM  Edinburgh Postnatal Depression Scale Screening Tool  I have been able to laugh and see the funny side of things. 0  I have looked forward with enjoyment to  things. 0  I have blamed myself unnecessarily when things went wrong. 1  I have been anxious or worried for no good reason. 2  I have felt scared or panicky for no good reason. 2  Things have been getting on top of me. 1  I have been so unhappy that I have had difficulty sleeping. 0  I have felt sad or miserable. 0  I have been so unhappy that I have been crying. 0  The thought of harming myself has occurred to me. 1  Edinburgh Postnatal Depression Scale Total 7      After visit meds:  Allergies as of 10/11/2022       Reactions   Prednisone    Insomnia, jittery with prednisone but can tolerate flonase.         Medication List     STOP taking these medications    aspirin 81 MG chewable tablet   cyclobenzaprine 5 MG tablet Commonly  known as: FLEXERIL   Pepcid AC Maximum Strength 20 MG tablet Generic drug: famotidine       TAKE these medications    acetaminophen 500 MG tablet Commonly known as: TYLENOL Take 500 mg by mouth every 6 (six) hours as needed for fever.   multivitamin-prenatal 27-0.8 MG Tabs tablet Take 1 tablet by mouth daily at 12 noon.         Discharge home in stable condition  Infant Disposition:home with mother Discharge instruction: per After Visit Summary and Postpartum booklet. Activity: Advance as tolerated. Pelvic rest for 6 weeks.  Diet: routine diet Anticipated Birth Control: Unsure Postpartum Appointment:4 weeks Additional Postpartum F/U: Postpartum Depression checkup Future Appointments:No future appointments. Follow up Visit:  [place], [facility]. Call in 4 day(s).   Contact information: [address]  [address] [place] [address] [phone]                     10/11/2022 Allyn Kenner, DO

## 2022-10-12 LAB — SURGICAL PATHOLOGY

## 2022-10-13 ENCOUNTER — Inpatient Hospital Stay (HOSPITAL_COMMUNITY)
Admission: AD | Admit: 2022-10-13 | Discharge: 2022-10-15 | DRG: 776 | Disposition: A | Discharge status: Home / Self Care | Payer: BC Managed Care – PPO | Attending: Obstetrics and Gynecology | Admitting: Obstetrics and Gynecology

## 2022-10-13 ENCOUNTER — Inpatient Hospital Stay (HOSPITAL_COMMUNITY): Payer: BC Managed Care – PPO

## 2022-10-13 ENCOUNTER — Encounter (HOSPITAL_COMMUNITY): Payer: Self-pay | Admitting: Obstetrics and Gynecology

## 2022-10-13 ENCOUNTER — Other Ambulatory Visit: Payer: Self-pay

## 2022-10-13 DIAGNOSIS — M79669 Pain in unspecified lower leg: Secondary | ICD-10-CM | POA: Diagnosis present

## 2022-10-13 DIAGNOSIS — O99345 Other mental disorders complicating the puerperium: Secondary | ICD-10-CM | POA: Diagnosis present

## 2022-10-13 DIAGNOSIS — J45909 Unspecified asthma, uncomplicated: Secondary | ICD-10-CM | POA: Diagnosis present

## 2022-10-13 DIAGNOSIS — O9953 Diseases of the respiratory system complicating the puerperium: Secondary | ICD-10-CM | POA: Diagnosis present

## 2022-10-13 DIAGNOSIS — O1495 Unspecified pre-eclampsia, complicating the puerperium: Secondary | ICD-10-CM | POA: Diagnosis not present

## 2022-10-13 DIAGNOSIS — F419 Anxiety disorder, unspecified: Secondary | ICD-10-CM | POA: Diagnosis present

## 2022-10-13 DIAGNOSIS — O874 Varicose veins of lower extremity in the puerperium: Secondary | ICD-10-CM | POA: Diagnosis present

## 2022-10-13 DIAGNOSIS — I8390 Asymptomatic varicose veins of unspecified lower extremity: Secondary | ICD-10-CM | POA: Diagnosis present

## 2022-10-13 DIAGNOSIS — R030 Elevated blood-pressure reading, without diagnosis of hypertension: Secondary | ICD-10-CM | POA: Diagnosis not present

## 2022-10-13 LAB — COMPREHENSIVE METABOLIC PANEL
ALT: 73 U/L — ABNORMAL HIGH (ref 0–44)
AST: 73 U/L — ABNORMAL HIGH (ref 15–41)
Albumin: 2.5 g/dL — ABNORMAL LOW (ref 3.5–5.0)
Alkaline Phosphatase: 124 U/L (ref 38–126)
Anion gap: 11 (ref 5–15)
BUN: 7 mg/dL (ref 6–20)
CO2: 22 mmol/L (ref 22–32)
Calcium: 8.7 mg/dL — ABNORMAL LOW (ref 8.9–10.3)
Chloride: 108 mmol/L (ref 98–111)
Creatinine, Ser: 0.88 mg/dL (ref 0.44–1.00)
GFR, Estimated: >60 mL/min (ref >60)
Glucose, Bld: 101 mg/dL — ABNORMAL HIGH (ref 70–99)
Potassium: 3.3 mmol/L — ABNORMAL LOW (ref 3.5–5.1)
Sodium: 141 mmol/L (ref 135–145)
Total Bilirubin: 0.6 mg/dL (ref 0.3–1.2)
Total Protein: 5.7 g/dL — ABNORMAL LOW (ref 6.5–8.1)

## 2022-10-13 LAB — CBC
HCT: 26.7 % — ABNORMAL LOW (ref 36.0–46.0)
Hemoglobin: 9.2 g/dL — ABNORMAL LOW (ref 12.0–15.0)
MCH: 31.7 pg (ref 26.0–34.0)
MCHC: 34.5 g/dL (ref 30.0–36.0)
MCV: 92.1 fL (ref 80.0–100.0)
Platelets: 269 10*3/uL (ref 150–400)
RBC: 2.9 MIL/uL — ABNORMAL LOW (ref 3.87–5.11)
RDW: 14.6 % (ref 11.5–15.5)
WBC: 12.2 10*3/uL — ABNORMAL HIGH (ref 4.0–10.5)
nRBC: 0.6 % — ABNORMAL HIGH (ref 0.0–0.2)

## 2022-10-13 MED ORDER — IBUPROFEN 800 MG PO TABS
800.0000 mg | ORAL_TABLET | Freq: Three times a day (TID) | ORAL | Status: DC
  Administered 2022-10-13 – 2022-10-15 (×5): 800 mg via ORAL
  Filled 2022-10-13 (×5): qty 1

## 2022-10-13 MED ORDER — ALBUTEROL SULFATE (2.5 MG/3ML) 0.083% IN NEBU: 3.0000 mL | INHALATION_SOLUTION | RESPIRATORY_TRACT | Status: DC | PRN

## 2022-10-13 MED ORDER — LABETALOL HCL 5 MG/ML IV SOLN: 40.0000 mg | INTRAVENOUS | Status: DC | PRN

## 2022-10-13 MED ORDER — MAGNESIUM SULFATE 40 GM/1000ML IV SOLN
2.0000 g/h | INTRAVENOUS | Status: AC
  Administered 2022-10-14: 2 g/h via INTRAVENOUS
  Filled 2022-10-13 (×2): qty 1000

## 2022-10-13 MED ORDER — NIFEDIPINE 10 MG PO CAPS
ORAL_CAPSULE | ORAL | Status: AC
  Filled 2022-10-13: qty 1

## 2022-10-13 MED ORDER — ESCITALOPRAM OXALATE 10 MG PO TABS
10.0000 mg | ORAL_TABLET | Freq: Every day | ORAL | Status: DC
  Administered 2022-10-13 – 2022-10-14 (×2): 10 mg via ORAL
  Filled 2022-10-13 (×2): qty 1

## 2022-10-13 MED ORDER — LABETALOL HCL 5 MG/ML IV SOLN: 20.0000 mg | INTRAVENOUS | Status: DC | PRN

## 2022-10-13 MED ORDER — HYDRALAZINE HCL 20 MG/ML IJ SOLN
5.0000 mg | INTRAMUSCULAR | Status: DC | PRN
  Administered 2022-10-13: 5 mg via INTRAVENOUS
  Filled 2022-10-13: qty 1

## 2022-10-13 MED ORDER — ACETAMINOPHEN 500 MG PO TABS
1000.0000 mg | ORAL_TABLET | Freq: Three times a day (TID) | ORAL | Status: DC | PRN
  Administered 2022-10-13 – 2022-10-15 (×2): 1000 mg via ORAL
  Filled 2022-10-13 (×2): qty 2

## 2022-10-13 MED ORDER — NIFEDIPINE ER OSMOTIC RELEASE 30 MG PO TB24
30.0000 mg | ORAL_TABLET | Freq: Every day | ORAL | Status: DC
  Administered 2022-10-14 – 2022-10-15 (×2): 30 mg via ORAL
  Filled 2022-10-13 (×2): qty 1

## 2022-10-13 MED ORDER — LACTATED RINGERS IV SOLN: INTRAVENOUS | Status: DC

## 2022-10-13 MED ORDER — ENOXAPARIN SODIUM 60 MG/0.6ML IJ SOSY
60.0000 mg | PREFILLED_SYRINGE | INTRAMUSCULAR | Status: DC
  Administered 2022-10-13 – 2022-10-14 (×2): 60 mg via SUBCUTANEOUS
  Filled 2022-10-13 (×3): qty 0.6

## 2022-10-13 MED ORDER — CYCLOBENZAPRINE HCL 5 MG PO TABS
7.5000 mg | ORAL_TABLET | Freq: Three times a day (TID) | ORAL | Status: DC | PRN
  Administered 2022-10-13: 7.5 mg via ORAL
  Filled 2022-10-13 (×2): qty 1.5

## 2022-10-13 MED ORDER — MAGNESIUM SULFATE BOLUS VIA INFUSION
4.0000 g | Freq: Once | INTRAVENOUS | Status: AC
  Administered 2022-10-13: 4 g via INTRAVENOUS
  Filled 2022-10-13: qty 1000

## 2022-10-13 MED ORDER — NIFEDIPINE 10 MG PO CAPS
10.0000 mg | ORAL_CAPSULE | Freq: Once | ORAL | Status: AC
  Administered 2022-10-13: 10 mg via ORAL

## 2022-10-13 MED ORDER — HYDRALAZINE HCL 20 MG/ML IJ SOLN
10.0000 mg | INTRAMUSCULAR | Status: DC | PRN
  Administered 2022-10-13: 10 mg via INTRAVENOUS
  Filled 2022-10-13: qty 1

## 2022-10-13 NOTE — H&P (Signed)
ANTEPARTUM PROGRESS NOTE S: Patient presented to office earlier today complaining of RUQ pain, SOB and headache. BP in office found to be 190/120. Was an IOL at term (39 6/7) for new GHTN. Repeat BP in MAU triage 178/82. Denies recent lifting at home. Does endorse anxiety, was given lexapro 50mg  during pregnancy but never started, would like to initiate at this time. PMHx also s/f BMI 39, asthma on PRN albuterol inhaler. Denies N/V. Notes BL UP pain that wraps around to back, not related to inspiration, denies CP  O:BP (!) 178/82 (BP Location: Right Arm)   Pulse 60   Temp 98 F (36.7 C) (Oral)   Resp 20   Wt 107.5 kg   SpO2 98%   Breastfeeding No   BMI 39.44 kg/m  Gen: obese, somewhat tearful but NAD CV: CTAB, RRR Abd: Bsx4, BL UQ TTP (mild, w/ deep palpation) GU deferred MSK: BL 1+ pedal edema Psych/Neuro: Anxious but otherwise WNL  Labs pending  A/P: This is a 32yo G1P1001 PPD#4 s/p SVD @ 39 6/7 presenting with persistent sever range BP meeting pp PreE w/ SF requirements. -Admit to [facility] -IV MgSO4 x24hrs, IV hydral protocol -Asthma: PRN albuterol -RUQ pain: [place] ordered -Anxiety: Lexapro 10mg  QHS -Tylenol/ibuprofen for PRN pain control Formula feeding infant  Anticipate 24hr MgSO4 with subsequent PO BP meds as needed afterwards. All questions answered

## 2022-10-13 NOTE — Progress Notes (Signed)
Labs returned from admission: 9.2/26.7/269, Cr 0.88, AST/ALT 73/73, uPC needs to be collect BP 138/82 (BP Location: Right Arm)   Pulse (!) 106   Temp 97.7 F (36.5 C) (Oral)   Resp 18   Ht 5\' 5"  (1.651 m)   Wt 107.5 kg   SpO2 100%   Breastfeeding No   BMI 39.44 kg/m  RUQ ULTRASOUND  IMPRESSION: 1. Common bile duct measures 7 mm, just above the upper limit of normal. No intrahepatic biliary ductal dilatation is seen. 2. No sonographic evidence of acute cholecystitis.  Reviewed with patient, reassure.d Feeling improved except HA minimally improved with tylenol/ibuprofen. Patient endorses migraine history, Flexaril usually relieves, will order this PRN. Repeat PreE labs in AM. Start procardia 30XL in AM as well. MgSO4 due to stop at 1730 tomorrow, orders amended properly

## 2022-10-13 NOTE — MAU Note (Signed)
.  Margaret Cunningham is a 32 y.o. postpartum vaginal delivery here in MAU reporting: was seen in the office today and had BP 190/120 with rib pain and SOB. Sent over for evaluation. Denies HA or visual disturbances.   Pain score: rib- 7 Vitals:   10/13/22 1543  BP: (!) 178/82  Pulse: 60  Resp: 20  Temp: 98.2 F (36.8 C)  SpO2: 98%

## 2022-10-14 LAB — CBC WITH DIFFERENTIAL/PLATELET
Abs Immature Granulocytes: 0.55 10*3/uL — ABNORMAL HIGH (ref 0.00–0.07)
Basophils Absolute: 0.1 10*3/uL (ref 0.0–0.1)
Basophils Relative: 1 %
Eosinophils Absolute: 0.2 10*3/uL (ref 0.0–0.5)
Eosinophils Relative: 2 %
HCT: 27.5 % — ABNORMAL LOW (ref 36.0–46.0)
Hemoglobin: 9.4 g/dL — ABNORMAL LOW (ref 12.0–15.0)
Immature Granulocytes: 5 %
Lymphocytes Relative: 17 %
Lymphs Abs: 2 10*3/uL (ref 0.7–4.0)
MCH: 31.3 pg (ref 26.0–34.0)
MCHC: 34.2 g/dL (ref 30.0–36.0)
MCV: 91.7 fL (ref 80.0–100.0)
Monocytes Absolute: 1.1 10*3/uL — ABNORMAL HIGH (ref 0.1–1.0)
Monocytes Relative: 9 %
Neutro Abs: 8.4 10*3/uL — ABNORMAL HIGH (ref 1.7–7.7)
Neutrophils Relative %: 66 %
Platelets: 279 10*3/uL (ref 150–400)
RBC: 3 MIL/uL — ABNORMAL LOW (ref 3.87–5.11)
RDW: 14.7 % (ref 11.5–15.5)
WBC: 12.3 10*3/uL — ABNORMAL HIGH (ref 4.0–10.5)
nRBC: 0.5 % — ABNORMAL HIGH (ref 0.0–0.2)

## 2022-10-14 LAB — COMPREHENSIVE METABOLIC PANEL
ALT: 63 U/L — ABNORMAL HIGH (ref 0–44)
AST: 54 U/L — ABNORMAL HIGH (ref 15–41)
Albumin: 2.4 g/dL — ABNORMAL LOW (ref 3.5–5.0)
Alkaline Phosphatase: 106 U/L (ref 38–126)
Anion gap: 13 (ref 5–15)
BUN: 5 mg/dL — ABNORMAL LOW (ref 6–20)
CO2: 24 mmol/L (ref 22–32)
Calcium: 7.2 mg/dL — ABNORMAL LOW (ref 8.9–10.3)
Chloride: 101 mmol/L (ref 98–111)
Creatinine, Ser: 0.74 mg/dL (ref 0.44–1.00)
GFR, Estimated: >60 mL/min (ref >60)
Glucose, Bld: 101 mg/dL — ABNORMAL HIGH (ref 70–99)
Potassium: 2.8 mmol/L — ABNORMAL LOW (ref 3.5–5.1)
Sodium: 138 mmol/L (ref 135–145)
Total Bilirubin: 0.5 mg/dL (ref 0.3–1.2)
Total Protein: 5.5 g/dL — ABNORMAL LOW (ref 6.5–8.1)

## 2022-10-14 NOTE — Progress Notes (Signed)
Chaplain responded to [facility] consult [name]. Pt was accompanied by several family members who came to meet baby Margaret Cunningham. She requested I leave the material, but not review education at this time. Chaplain left new AD packet as well as two fliers and provided information for the Advance Care Planning office if she desires support after discharge.  Please page as further needs arise.  Donald Prose. Elyn Peers, M.Div. [facility] Chaplain Pager [phone] Office [phone]

## 2022-10-14 NOTE — Progress Notes (Signed)
AP NOTE S: Feeling improved from admission. HA 1/10 this AM, was able to sleep with flexeril (resolved migraine). Denies other PreE symptoms. Notes some calf pain this AM but has known bruise to examine. Minimal lochia.   O: BP 129/78 (BP Location: Right Arm)   Pulse 84   Temp 97.9 F (36.6 C) (Oral)   Resp 17   Ht 5\' 5"  (1.651 m)   Wt 107.5 kg   SpO2 99%   Breastfeeding No   BMI 39.44 kg/m  Gen: obese,  NAD CV: CTAB, RRR Abd: Bsx4, BL UQ TTP (improving from admission) GU deferred MSK: BL trace pedal edema. Quarter-size ecchymosis on left inferior calf, no palpable cords nor erythema/warmth in leg, FROM. SCDs in place Psych/Neuro: Anxious but otherwise WNL  Labs: 9.4/27.5/279, AST/ALT 54/63, Cr 0.74 UOP 6800cc  A/P: HD#2 for PP PreE admission. PMHx s/f asthma, anxiety, BMI 39 1) pp PreE -IV MGSO4 to finish at 1730 -Nif 30XL on board -BP 119-138/72-89 since admission (reviewed multiple IV meds to be non-severe within first hour)  2) Anxiety - stable -Continue Lexapro 10mg   3) Asthma - stable -Continue prn inhaler   4) Calf pain -Superficial bruising appears c/w known varicose vein, present prior to delivery per patient -Low concern for DVT given SVD and pp lovenox while in-house but advised patient to let us know if symptoms change  Anticipate DC home HD#3 pending BP control. Baby formula fed

## 2022-10-15 MED ORDER — NIFEDIPINE ER 30 MG PO TB24: 30.0000 mg | ORAL_TABLET | Freq: Every day | ORAL | 1 refills | Status: AC

## 2022-10-15 MED ORDER — IBUPROFEN 600 MG PO TABS: 600.0000 mg | ORAL_TABLET | Freq: Four times a day (QID) | ORAL | Status: DC | PRN

## 2022-10-15 MED ORDER — ESCITALOPRAM OXALATE 10 MG PO TABS: 10.0000 mg | ORAL_TABLET | Freq: Every day | ORAL | 6 refills | Status: DC

## 2022-10-15 MED ORDER — SENNA 8.6 MG PO TABS
2.0000 | ORAL_TABLET | Freq: Every day | ORAL | Status: DC
  Administered 2022-10-15: 17.2 mg via ORAL
  Filled 2022-10-15: qty 2

## 2022-10-15 MED ORDER — NIFEDIPINE ER OSMOTIC RELEASE 30 MG PO TB24
30.0000 mg | ORAL_TABLET | Freq: Once | ORAL | Status: AC
  Administered 2022-10-15: 30 mg via ORAL
  Filled 2022-10-15: qty 1

## 2022-10-15 MED ORDER — NIFEDIPINE ER OSMOTIC RELEASE 30 MG PO TB24: 30.0000 mg | ORAL_TABLET | Freq: Every day | ORAL | Status: DC

## 2022-10-15 NOTE — Progress Notes (Signed)
Patient given discharge instructions and denies any questions or concerns. Pt discharged and off unit via wheelchair at this time.

## 2022-10-15 NOTE — Progress Notes (Signed)
Discharge orders changed to take 90 mg of Procardia (three tablets) instead of previously ordered 30 mg (1 tablet) of Procardia, per verbal order from Dr. Willis Modena.

## 2022-10-15 NOTE — Discharge Summary (Signed)
Physician Discharge Summary  Patient ID: Margaret Cunningham MRN: [id number] DOB/AGE: 32/04/1991 32 y.o.  Admit date: 10/13/2022 Discharge date: 10/15/2022  Admission Diagnoses:  postpartum preeclampsia  Discharge Diagnoses: postpartum preeclampsia Principal Problem:   Preeclampsia in postpartum period   Discharged Condition: good  Hospital Course: She was admitted 3-26 pm with significantly elevated BP.  She was started on magnesium for seizure prophylaxis, IV hydralazine for severe range BP.  BP improved on magnesium, labs normal.  After magnesium d/c'ed, started on Procardia XL 30 mg daily to control BP.  On the morning of 3-28, BP was controlled and she was felt to be stable for discharge.  BP then went up again and she received 2 more doses of procardia XL 30 mg and BP improved, felt to be stable enough for discharge on Procardia XL 90 mg daily  Discharge Exam: Blood pressure (!) 145/86, pulse (!) 113, temperature 98 F (36.7 C), temperature source Oral, resp. rate 18, height 5\' 5"  (1.651 m), weight 107.5 kg, SpO2 100 %, not currently breastfeeding. General appearance: alert  Disposition: Discharge disposition: 01-Home or Self Care       Discharge Instructions     Diet - low sodium heart healthy   Complete by: As directed       Allergies as of 10/15/2022       Reactions   Prednisone    Insomnia, jittery with prednisone but can tolerate flonase.         Medication List     TAKE these medications    acetaminophen 500 MG tablet Commonly known as: TYLENOL Take 500 mg by mouth every 6 (six) hours as needed for fever.   escitalopram 10 MG tablet Commonly known as: LEXAPRO Take 1 tablet (10 mg total) by mouth at bedtime.   multivitamin-prenatal 27-0.8 MG Tabs tablet Take 1 tablet by mouth daily at 12 noon.   NIFEdipine 30 MG 24 hr tablet Commonly known as: ADALAT CC Take 1 tablet (30 mg total) by mouth daily.        Follow-up Information     Shivaji,  Melida Quitter, MD. Schedule an appointment as soon as possible for a visit.   Specialty: Obstetrics and Gynecology Why: for BP check Contact information: [address] [place] [address] [phone]                 Signed: Blane Ohara Livana Yerian 10/15/2022, 7:31 PM

## 2022-10-15 NOTE — Discharge Instructions (Signed)
Preeclampsia precautions

## 2022-10-15 NOTE — Progress Notes (Signed)
HD #3, PPD #6, pp preeclampsia  Feeling good, no problems Afeb, VSS, BP 116-143/72-96 Fundus firm  BP controlled with procardia, s/p magnesium, will d/c home

## 2022-10-18 ENCOUNTER — Telehealth: Payer: Self-pay | Admitting: Family Medicine

## 2022-10-18 NOTE — Telephone Encounter (Signed)
Please check on patient re: recent admission.  I hope she is feeling better.    Please given her the phone number about new mother's support groups at the hospital - Perinatal Education [phone].  I thought there was an in person support group but it looks like it is now online on Wednesday mornings.  Thanks.

## 2022-10-20 NOTE — Telephone Encounter (Signed)
Spoke with patient and she is doing well. BP has been under control with the new medication the hospital put her on. I talked to her about the support group but she was not in a position to be able to write information down and asked me to send in a mychart message. Message has been sent.

## 2022-10-20 NOTE — Telephone Encounter (Signed)
Noted. Thanks.

## 2022-10-22 ENCOUNTER — Telehealth (HOSPITAL_COMMUNITY): Payer: Self-pay | Admitting: *Deleted

## 2022-10-22 ENCOUNTER — Telehealth (HOSPITAL_COMMUNITY): Payer: Self-pay

## 2022-10-22 NOTE — Telephone Encounter (Signed)
Preadmission testing 

## 2022-10-22 NOTE — Telephone Encounter (Signed)
Left phone voicemail message.  Odis Hollingshead, RN 10-22-2022 at 3:11pm

## 2023-06-14 ENCOUNTER — Other Ambulatory Visit: Payer: Self-pay | Admitting: Obstetrics and Gynecology

## 2023-06-14 DIAGNOSIS — N6320 Unspecified lump in the left breast, unspecified quadrant: Secondary | ICD-10-CM

## 2023-07-22 ENCOUNTER — Ambulatory Visit
Admission: RE | Admit: 2023-07-22 | Discharge: 2023-07-22 | Disposition: A | Discharge status: Home / Self Care | Payer: No Typology Code available for payment source | Source: Ambulatory Visit | Attending: Obstetrics and Gynecology | Admitting: Obstetrics and Gynecology

## 2023-07-22 ENCOUNTER — Other Ambulatory Visit: Payer: Self-pay | Admitting: Obstetrics and Gynecology

## 2023-07-22 DIAGNOSIS — N6320 Unspecified lump in the left breast, unspecified quadrant: Secondary | ICD-10-CM

## 2023-07-26 ENCOUNTER — Other Ambulatory Visit: Payer: No Typology Code available for payment source

## 2023-07-30 ENCOUNTER — Ambulatory Visit
Admission: RE | Admit: 2023-07-30 | Discharge: 2023-07-30 | Disposition: A | Discharge status: Home / Self Care | Payer: No Typology Code available for payment source | Source: Ambulatory Visit | Attending: Obstetrics and Gynecology | Admitting: Obstetrics and Gynecology

## 2023-07-30 DIAGNOSIS — N6320 Unspecified lump in the left breast, unspecified quadrant: Secondary | ICD-10-CM

## 2023-07-30 HISTORY — PX: BREAST BIOPSY: SHX20

## 2023-08-02 LAB — SURGICAL PATHOLOGY

## 2023-10-17 ENCOUNTER — Telehealth: Payer: Self-pay | Admitting: Family Medicine

## 2023-10-17 MED ORDER — ESCITALOPRAM OXALATE 5 MG PO TABS: 5.0000 mg | ORAL_TABLET | Freq: Every day | ORAL | 1 refills | Status: AC

## 2023-10-17 NOTE — Telephone Encounter (Signed)
 Please update patient.  She needed refill on 5 mg Lexapro sent to [facility] pharmacy.  I got that done.  Please see about getting her set up for a yearly visit this summer when possible.  Thanks.

## 2023-10-19 NOTE — Telephone Encounter (Signed)
 LM for pt to returncall

## 2023-10-19 NOTE — Telephone Encounter (Signed)
 Spoke with pt. She is aware that Dr. Para March refilled her medication (she said thank you!). Attempted to schedule pt's yearly visit, states that she will have an insurance change in June. She will call to schedule her yearly appointment after this change.

## 2023-10-20 NOTE — Telephone Encounter (Signed)
 Noted. Thanks.
